# Patient Record
Sex: Female | Born: 1955
Health system: Southern US, Community
[De-identification: ages and names within clinical notes are randomized; demographics above are authoritative.]

## PROBLEM LIST (undated history)

## (undated) DIAGNOSIS — R011 Cardiac murmur, unspecified: Secondary | ICD-10-CM

## (undated) DIAGNOSIS — M810 Age-related osteoporosis without current pathological fracture: Secondary | ICD-10-CM

## (undated) DIAGNOSIS — M7989 Other specified soft tissue disorders: Secondary | ICD-10-CM

## (undated) DIAGNOSIS — E079 Disorder of thyroid, unspecified: Secondary | ICD-10-CM

## (undated) DIAGNOSIS — R5383 Other fatigue: Secondary | ICD-10-CM

## (undated) DIAGNOSIS — M1710 Unilateral primary osteoarthritis, unspecified knee: Secondary | ICD-10-CM

## (undated) DIAGNOSIS — M1990 Unspecified osteoarthritis, unspecified site: Secondary | ICD-10-CM

## (undated) DIAGNOSIS — T4145XA Adverse effect of unspecified anesthetic, initial encounter: Secondary | ICD-10-CM

## (undated) DIAGNOSIS — R112 Nausea with vomiting, unspecified: Secondary | ICD-10-CM

## (undated) DIAGNOSIS — H04123 Dry eye syndrome of bilateral lacrimal glands: Secondary | ICD-10-CM

## (undated) DIAGNOSIS — E039 Hypothyroidism, unspecified: Secondary | ICD-10-CM

## (undated) DIAGNOSIS — R51 Headache: Secondary | ICD-10-CM

## (undated) DIAGNOSIS — M797 Fibromyalgia: Secondary | ICD-10-CM

## (undated) DIAGNOSIS — C801 Malignant (primary) neoplasm, unspecified: Secondary | ICD-10-CM

## (undated) DIAGNOSIS — M791 Myalgia, unspecified site: Secondary | ICD-10-CM

## (undated) DIAGNOSIS — R202 Paresthesia of skin: Secondary | ICD-10-CM

## (undated) DIAGNOSIS — Z9889 Other specified postprocedural states: Secondary | ICD-10-CM

## (undated) DIAGNOSIS — T8859XA Other complications of anesthesia, initial encounter: Secondary | ICD-10-CM

## (undated) DIAGNOSIS — M542 Cervicalgia: Secondary | ICD-10-CM

## (undated) DIAGNOSIS — E890 Postprocedural hypothyroidism: Secondary | ICD-10-CM

## (undated) DIAGNOSIS — F419 Anxiety disorder, unspecified: Secondary | ICD-10-CM

## (undated) DIAGNOSIS — K219 Gastro-esophageal reflux disease without esophagitis: Secondary | ICD-10-CM

## (undated) DIAGNOSIS — M5030 Other cervical disc degeneration, unspecified cervical region: Secondary | ICD-10-CM

## (undated) DIAGNOSIS — M7730 Calcaneal spur, unspecified foot: Secondary | ICD-10-CM

## (undated) DIAGNOSIS — C73 Malignant neoplasm of thyroid gland: Secondary | ICD-10-CM

## (undated) DIAGNOSIS — M722 Plantar fascial fibromatosis: Secondary | ICD-10-CM

## (undated) HISTORY — DX: Disorder of thyroid, unspecified: E07.9

## (undated) HISTORY — DX: Other fatigue: R53.83

## (undated) HISTORY — DX: Dry eye syndrome of bilateral lacrimal glands: H04.123

## (undated) HISTORY — DX: Myalgia, unspecified site: M79.10

## (undated) HISTORY — DX: Other cervical disc degeneration, unspecified cervical region: M50.30

## (undated) HISTORY — PX: ABDOMINAL HYSTERECTOMY: SHX81

## (undated) HISTORY — DX: Age-related osteoporosis without current pathological fracture: M81.0

## (undated) HISTORY — PX: KNEE SURGERY: SHX244

## (undated) HISTORY — DX: Other specified soft tissue disorders: M79.89

## (undated) HISTORY — PX: BUNIONECTOMY: SHX129

---
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
## 1997-03-04 HISTORY — PX: OTHER SURGICAL HISTORY: SHX169

## 1997-12-19 ENCOUNTER — Other Ambulatory Visit: Admission: RE | Admit: 1997-12-19 | Discharge: 1997-12-19 | Payer: Self-pay | Admitting: Obstetrics and Gynecology

## 2000-03-12 ENCOUNTER — Other Ambulatory Visit: Admission: RE | Admit: 2000-03-12 | Discharge: 2000-03-12 | Payer: Self-pay | Admitting: Obstetrics and Gynecology

## 2001-04-17 ENCOUNTER — Other Ambulatory Visit: Admission: RE | Admit: 2001-04-17 | Discharge: 2001-04-17 | Payer: Self-pay | Admitting: Obstetrics and Gynecology

## 2002-08-18 ENCOUNTER — Other Ambulatory Visit: Admission: RE | Admit: 2002-08-18 | Discharge: 2002-08-18 | Payer: Self-pay | Admitting: Obstetrics and Gynecology

## 2002-08-24 ENCOUNTER — Encounter: Payer: Self-pay | Admitting: Obstetrics and Gynecology

## 2002-08-24 ENCOUNTER — Encounter: Admission: RE | Admit: 2002-08-24 | Discharge: 2002-08-24 | Payer: Self-pay | Admitting: Obstetrics and Gynecology

## 2005-01-03 ENCOUNTER — Other Ambulatory Visit: Admission: RE | Admit: 2005-01-03 | Discharge: 2005-01-03 | Payer: Self-pay | Admitting: Obstetrics and Gynecology

## 2006-09-19 ENCOUNTER — Ambulatory Visit (HOSPITAL_COMMUNITY): Admission: RE | Admit: 2006-09-19 | Discharge: 2006-09-19 | Payer: Self-pay | Admitting: Obstetrics and Gynecology

## 2006-11-26 ENCOUNTER — Other Ambulatory Visit: Admission: RE | Admit: 2006-11-26 | Discharge: 2006-11-26 | Payer: Self-pay | Admitting: Interventional Radiology

## 2006-11-26 ENCOUNTER — Encounter: Admission: RE | Admit: 2006-11-26 | Discharge: 2006-11-26 | Payer: Self-pay | Admitting: Surgery

## 2006-11-26 ENCOUNTER — Encounter (INDEPENDENT_AMBULATORY_CARE_PROVIDER_SITE_OTHER): Payer: Self-pay | Admitting: Interventional Radiology

## 2007-06-19 ENCOUNTER — Encounter: Admission: RE | Admit: 2007-06-19 | Discharge: 2007-06-19 | Payer: Self-pay | Admitting: Surgery

## 2008-03-01 ENCOUNTER — Encounter: Admission: RE | Admit: 2008-03-01 | Discharge: 2008-03-01 | Payer: Self-pay | Admitting: Endocrinology

## 2008-12-01 IMAGING — US US BIOPSY
1 series · 7 of 7 positions shown · non-contrast
Comparison: none

CLINICAL DATA: Patient with history of left thyroid nodule and thyroid ultrasound at [HOSPITAL] on 09/19/06 which revealed multiple bilateral nodules, the largest of which is in the left upper pole measuring 1.8 x 1.1 x 1.4 cm.  Request is now made for fine needle aspiration of this dominant left upper pole thyroid nodule.
ULTRASOUND-GUIDED FINE NEEDLE ASPIRATION, DOMINANT LEFT UPPER POLE THYROID NODULE:

[Series 1: us biopsy · 7 acquisitions, 7 frames shown]
[im 1/7]
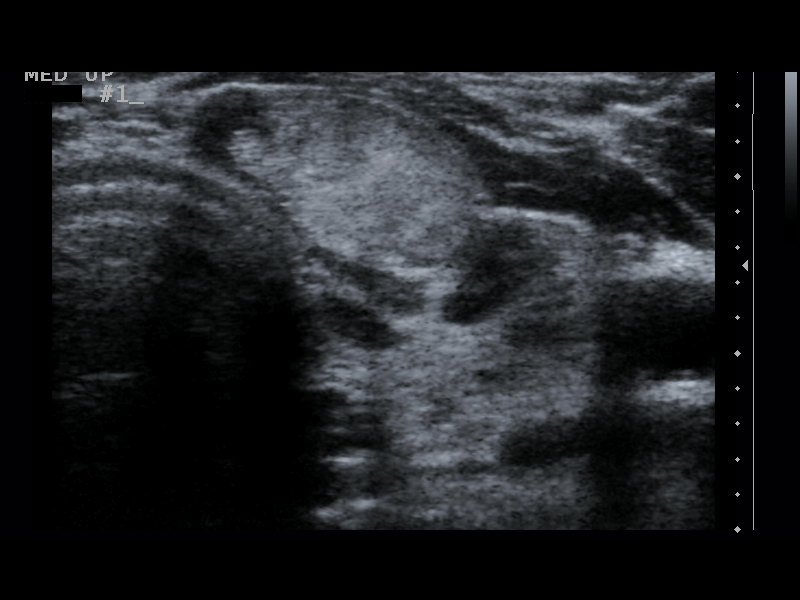
[im 2/7]
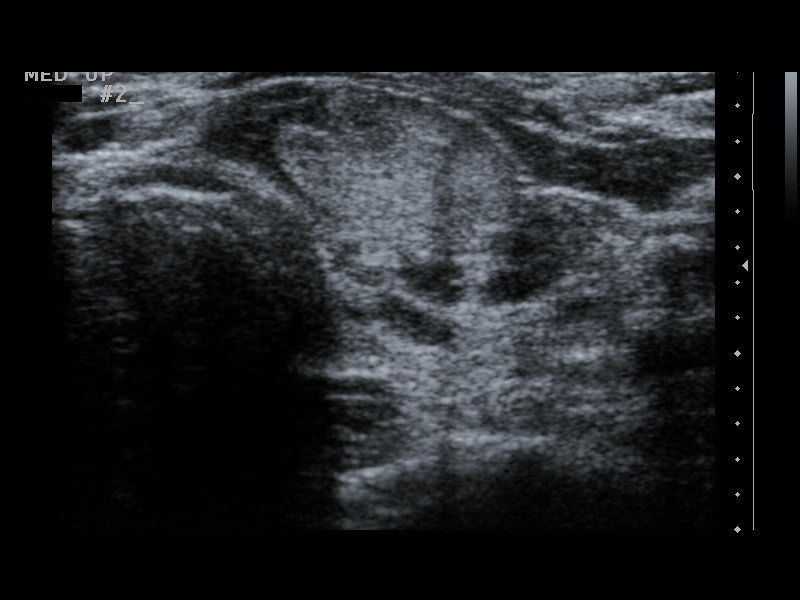
[im 3/7]
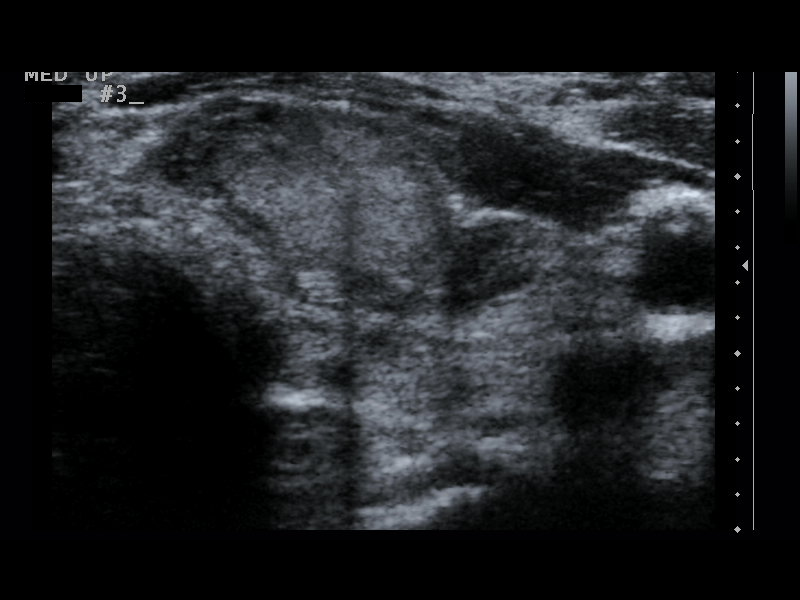
[im 4/7]
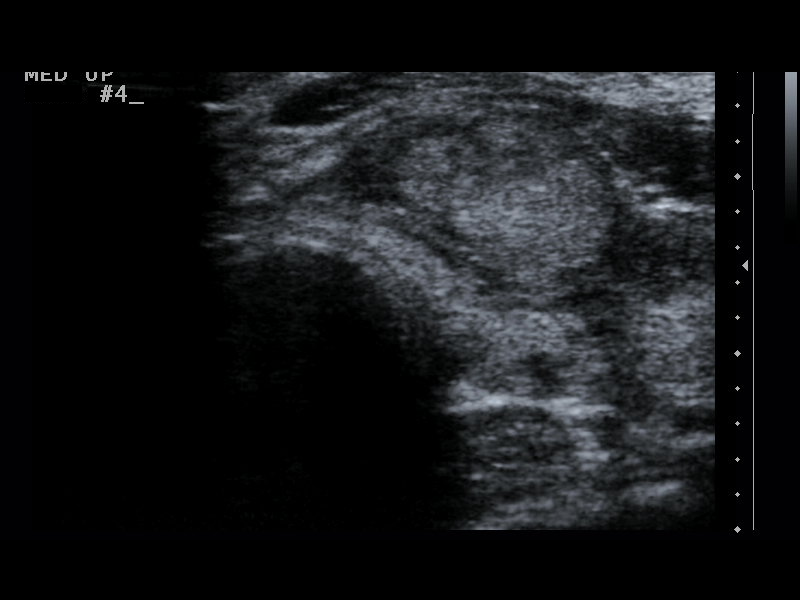
[im 5/7]
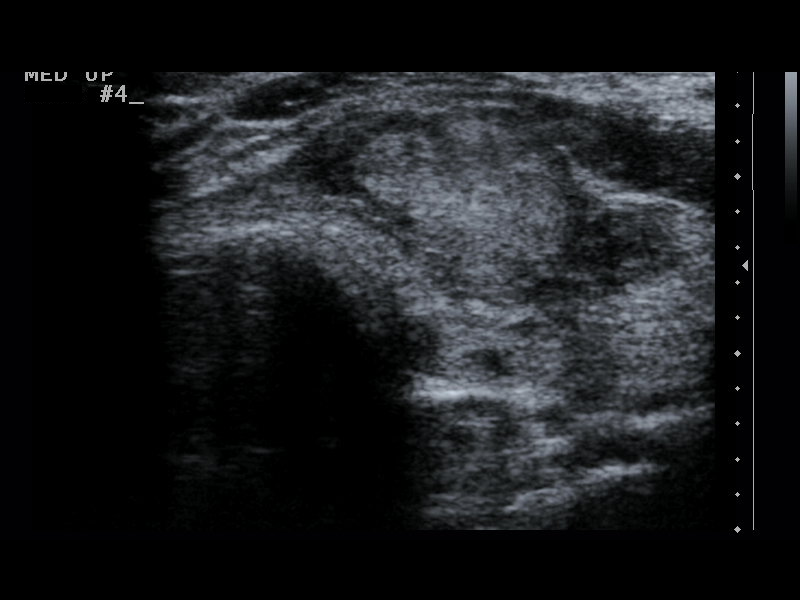
[im 6/7]
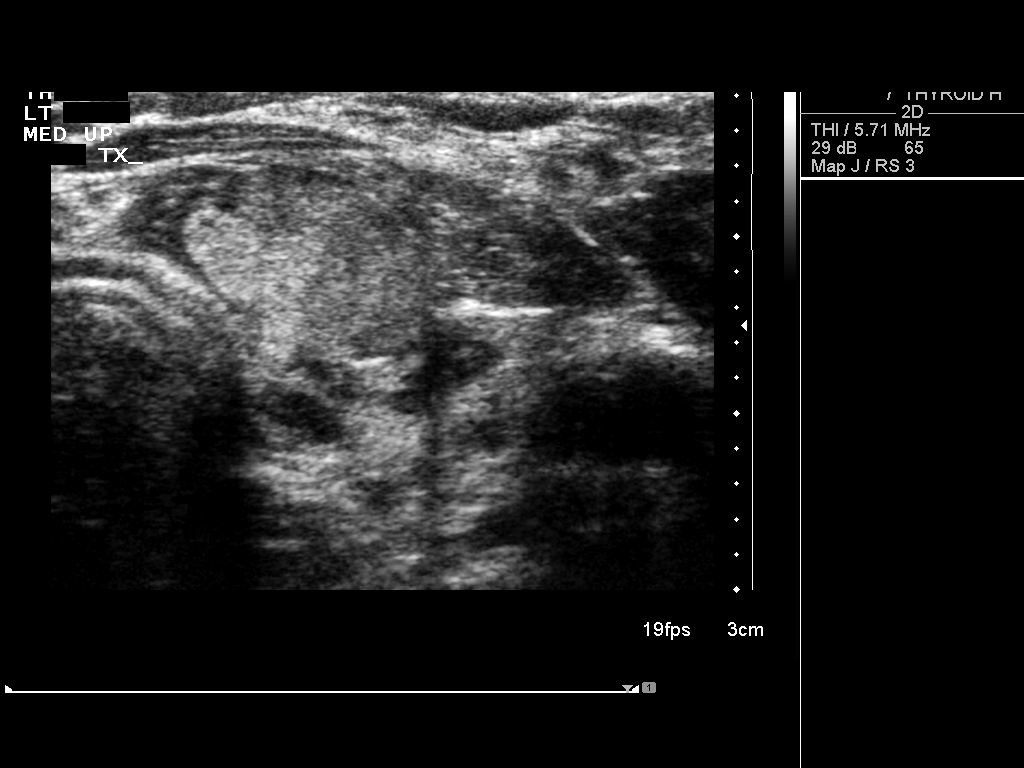
[im 7/7]
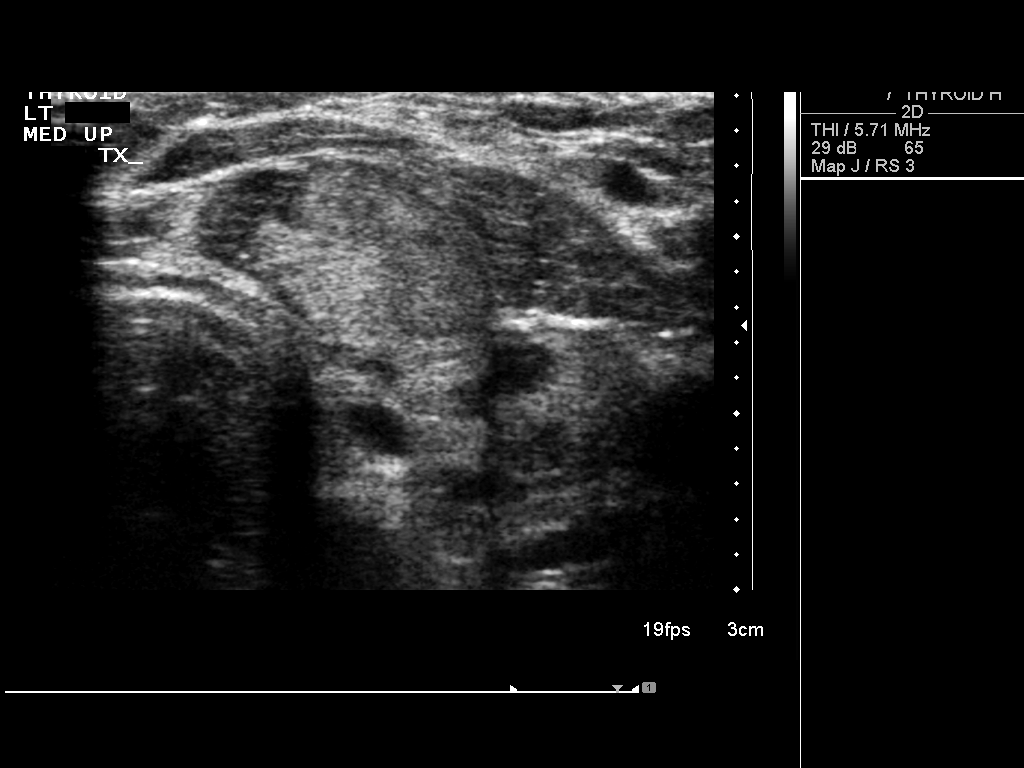

[7 of 7 positions shown; findings below may reference images not displayed]

FINDINGS: The above procedure was thoroughly discussed with the patient and written informed consent was obtained. 
Ultrasound was then performed to localize and mark an adequate site for the biopsy.  The patient was then prepped and draped in a normal sterile fashion.  1% lidocaine was used for local anesthesia.  Under direct ultrasound guidance, four passes were made using  25 gauge hypodermic needles into the dominant nodule located within the left upper pole of the thyroid.  Ultrasound confirmed placement of the needle on all four occasions.  The specimens were sent to Pathology for further analysis.  Post procedure imaging demonstrated no hematoma or immediate complication.  The patient tolerated the procedure well.
IMPRESSION: Successful ultrasound-guided fine needle aspiration, dominant left upper pole thyroid nodule.  Final pathology pending.

## 2009-02-17 ENCOUNTER — Encounter: Admission: RE | Admit: 2009-02-17 | Discharge: 2009-02-17 | Payer: Self-pay | Admitting: Endocrinology

## 2009-06-24 IMAGING — US US SOFT TISSUE HEAD/NECK
1 series · 14 of 25 positions shown · non-contrast
Comparison: 09/19/2006

CLINICAL DATA: Multiple thyroid nodules

THYROID ULTRASOUND
TECHNIQUE: Ultrasound examination of the thyroid gland and
adjacent soft tissues was performed.

[Series 1: us soft tissue head/neck · 0.08mm/px · 14 of 33 slices shown]
[im 1/33]
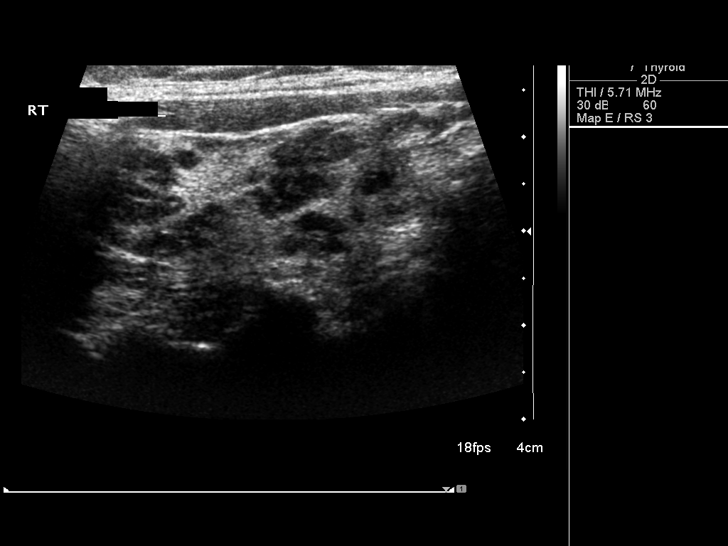
[im 3/33]
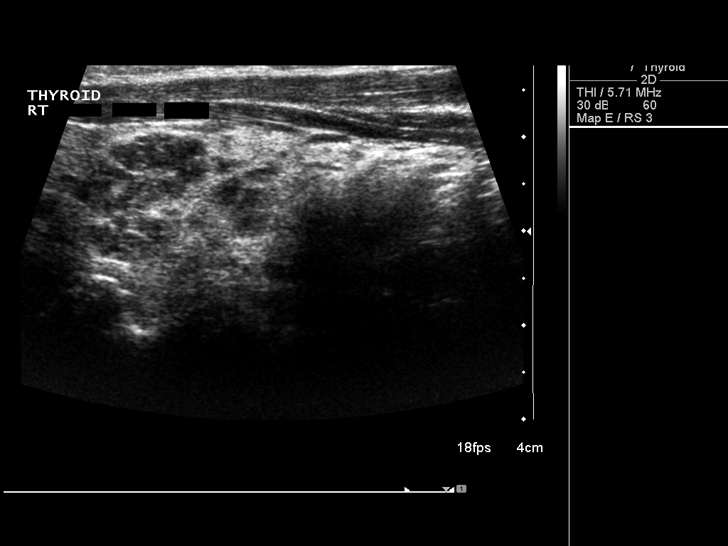
[im 6/33]
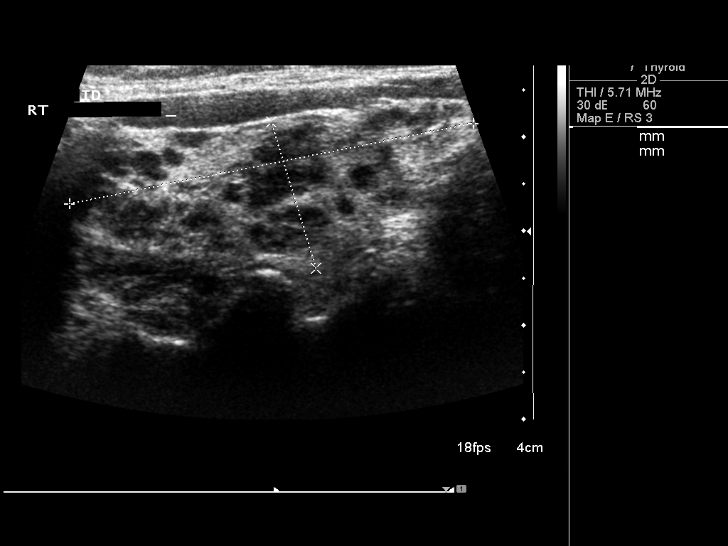
[im 9/33]
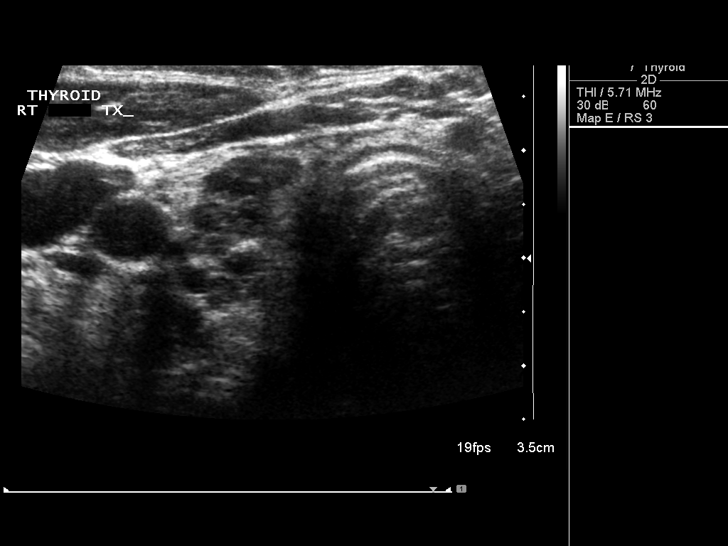
[im 11/33]
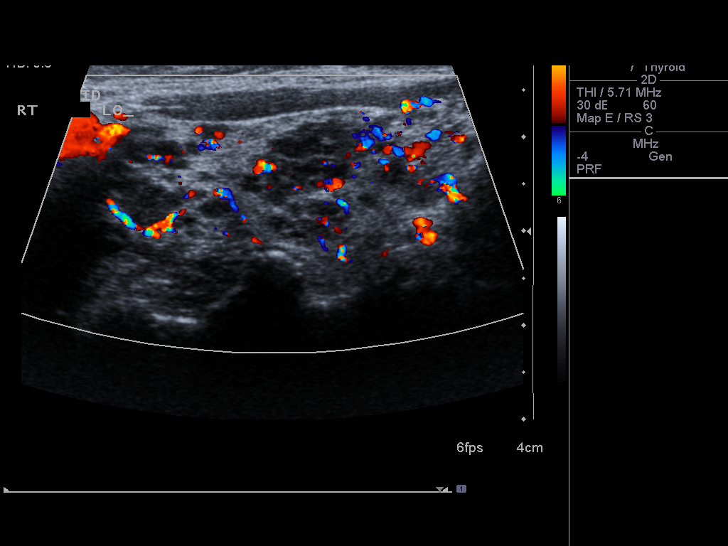
[im 13/33]
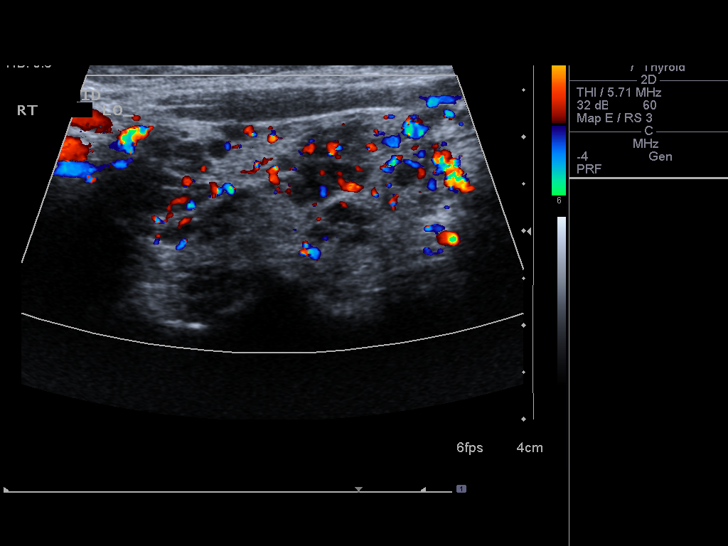
[im 15/33]
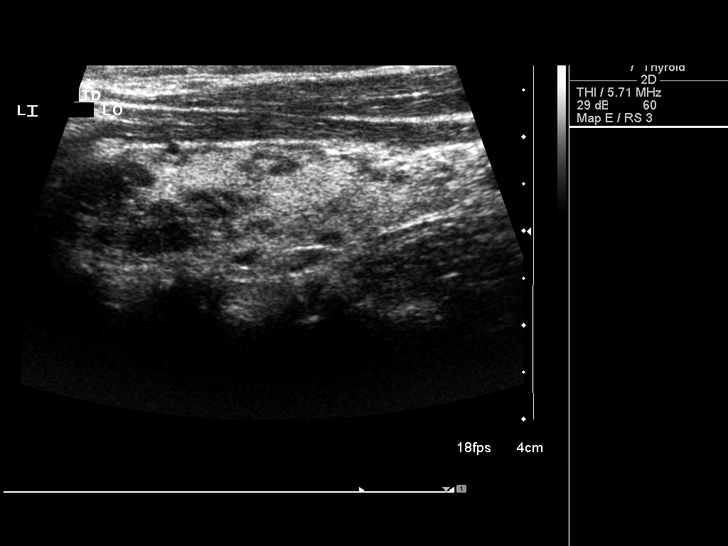
[im 18/33]
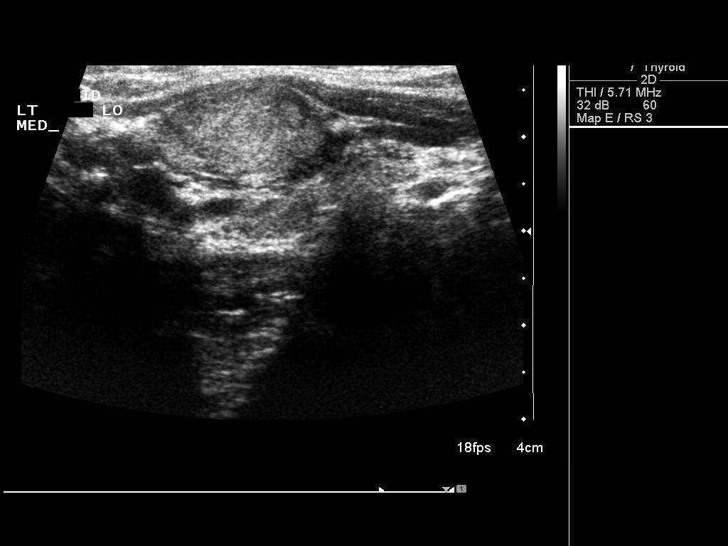
[im 21/33]
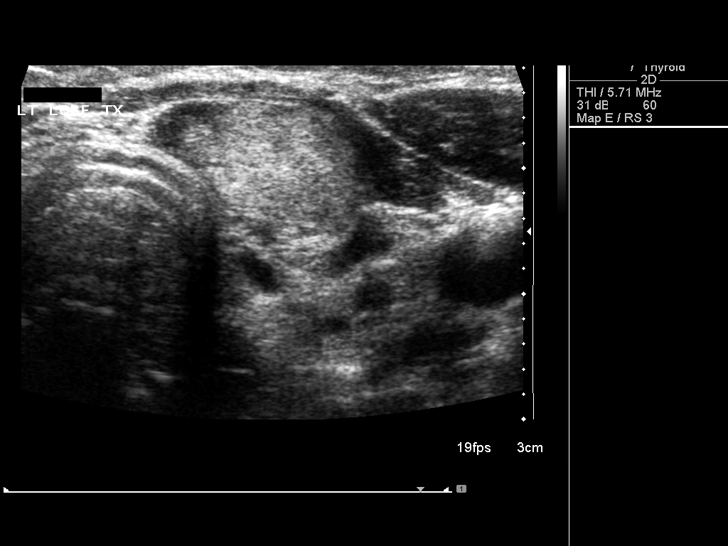
[im 22/33]
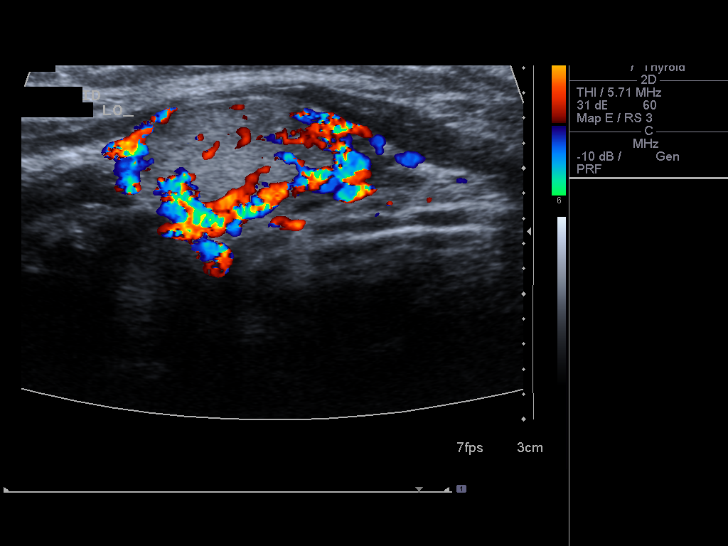
[im 25/33]
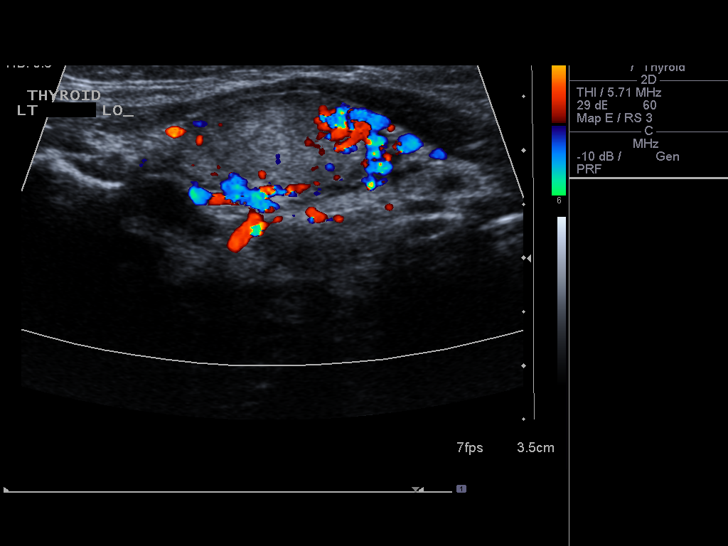
[im 27/33]
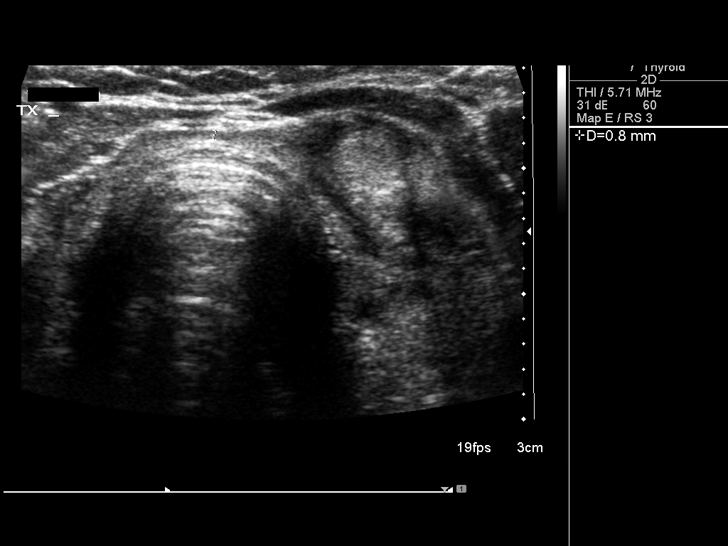
[im 30/33]
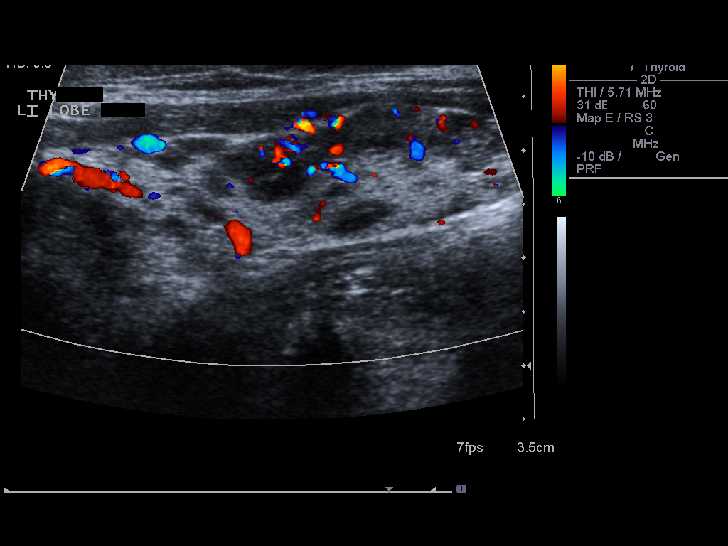
[im 33/33]
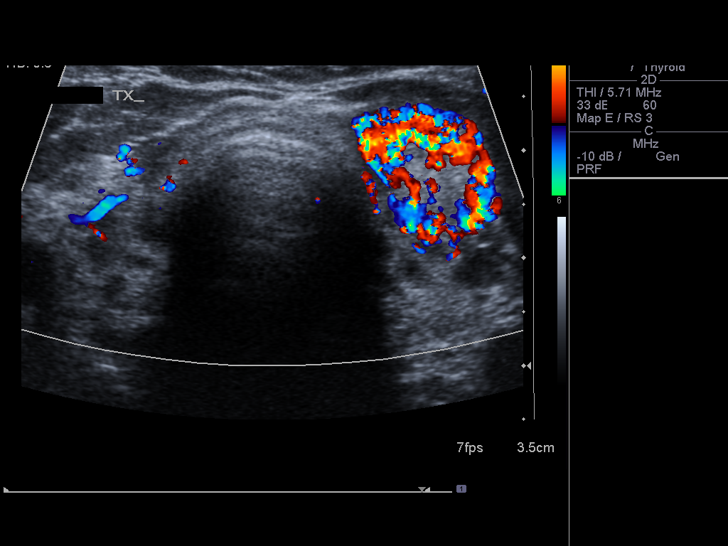

[14 of 25 positions shown; findings below may reference images not displayed]

FINDINGS: The right thyroid lobe measures 4.5 x 1.6 x 1.5 cm.  The lobe
measures 4.8 x 1.5 x 1.8 cm.  The isthmus is eight mm in thickenss.
Thyroid parenchyma is markedly heterogeneous throughout.  A 2.1 x
0.9 x 1.6 cm solid nodule is identified in the medial aspect of the
left thyroid lobe.  This does show associated flow signal on color
Doppler imaging bed no detectable dystrophic calcification is seen
within the lesion.  This represents the nodule which was biopsied
on 11/26/06.
IMPRESSION: No substantial interval change exam.  The thyroid parenchyma
remains markedly heterogeneous.  Dominant nodule in the left
thyroid lobe is again identified and has been biopsied previously.
Although nodules have been measured within the thyroid parenchyma
bilaterally on previous studies, on today's exam these are markedly
ill-defined and have features more suggestive of simply being
related to the background parenchymal heterogeneity.

## 2010-03-07 IMAGING — US US SOFT TISSUE HEAD/NECK
1 series · 14 of 16 positions shown · non-contrast
Comparison: Ultrasound of the thyroid of 06/19/2007

CLINICAL DATA: Follow up of thyroid nodule

THYROID ULTRASOUND
TECHNIQUE: Ultrasound examination of the thyroid gland and
adjacent soft tissues was performed.

[Series 1: us soft tissue head/neck · 0.08mm/px · 14 of 16 slices shown]
[im 1/16]
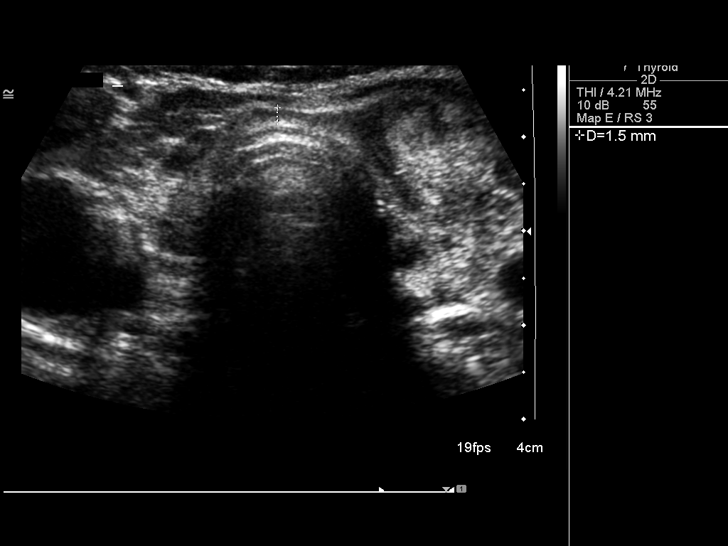
[im 2/16]
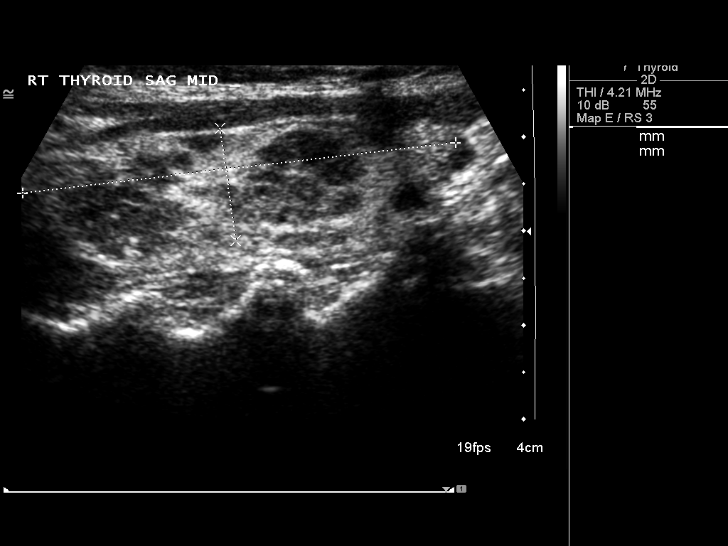
[im 3/16]
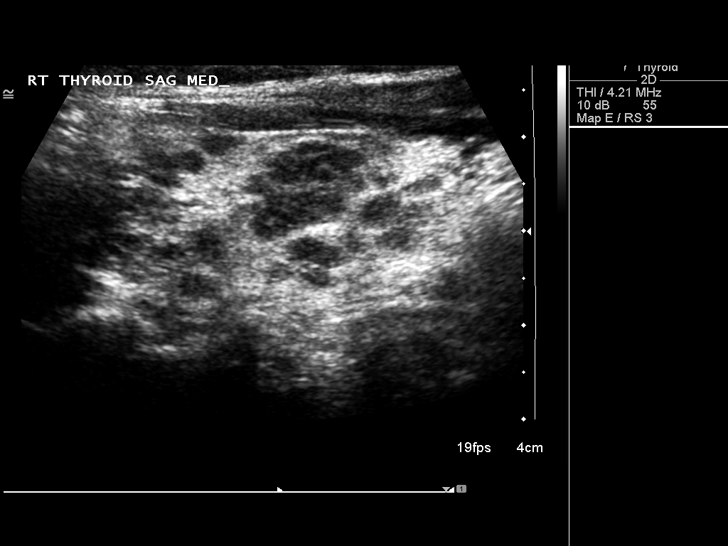
[im 5/16]
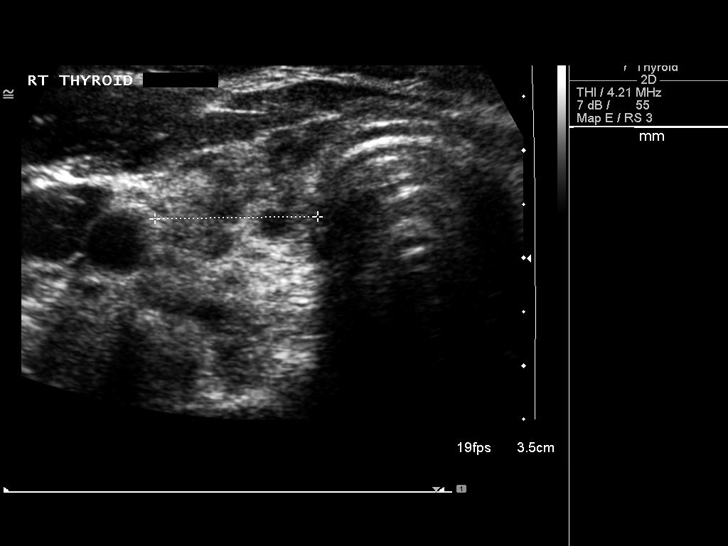
[im 6/16]
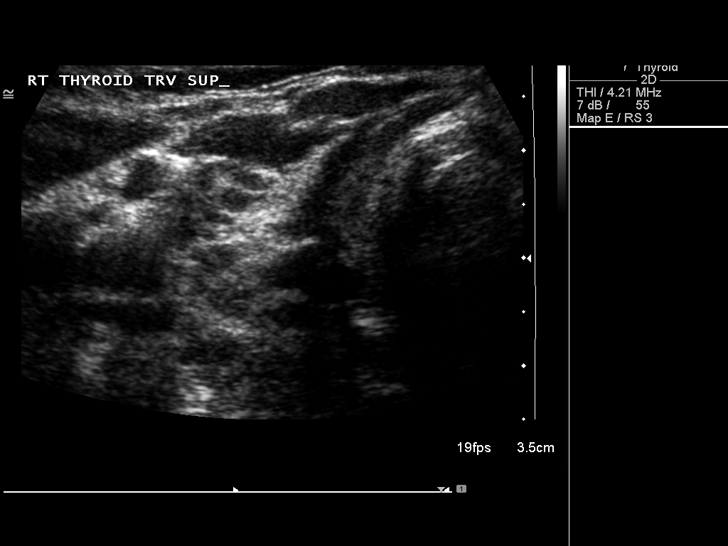
[im 7/16]
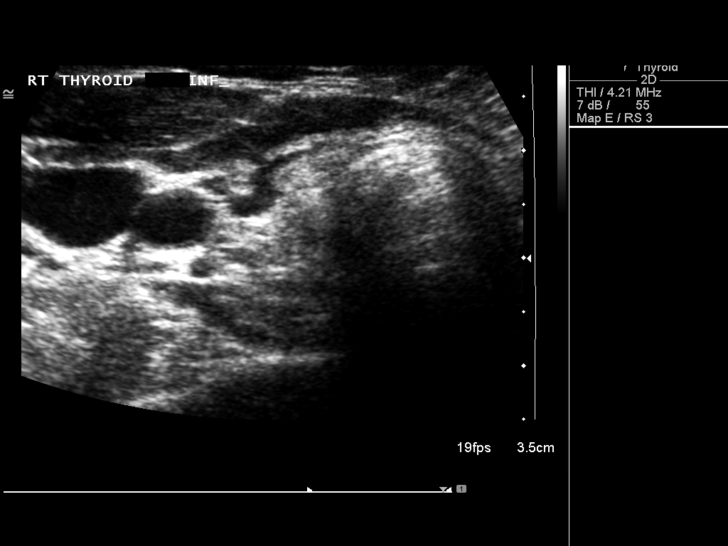
[im 8/16]
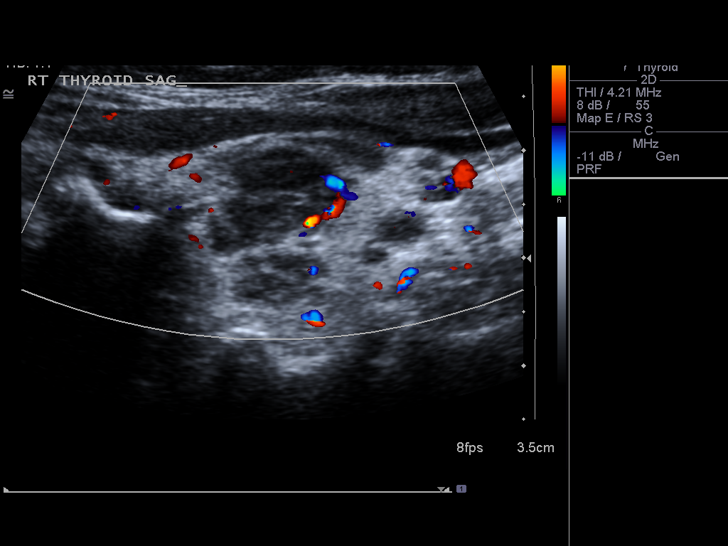
[im 9/16]
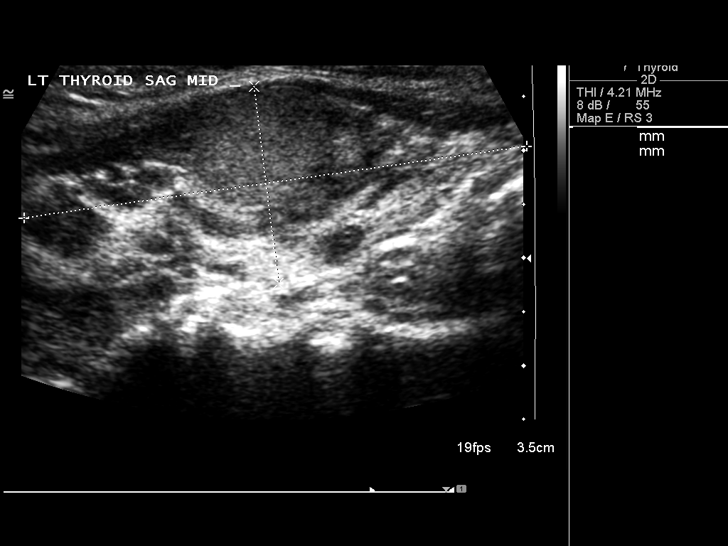
[im 10/16]
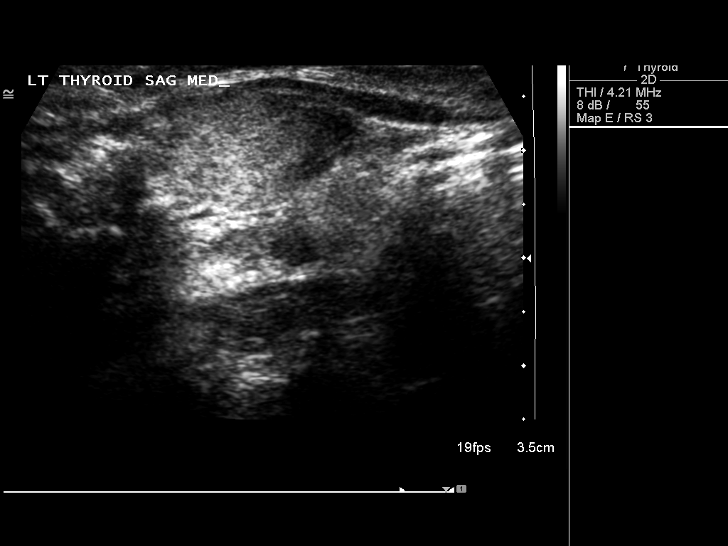
[im 11/16]
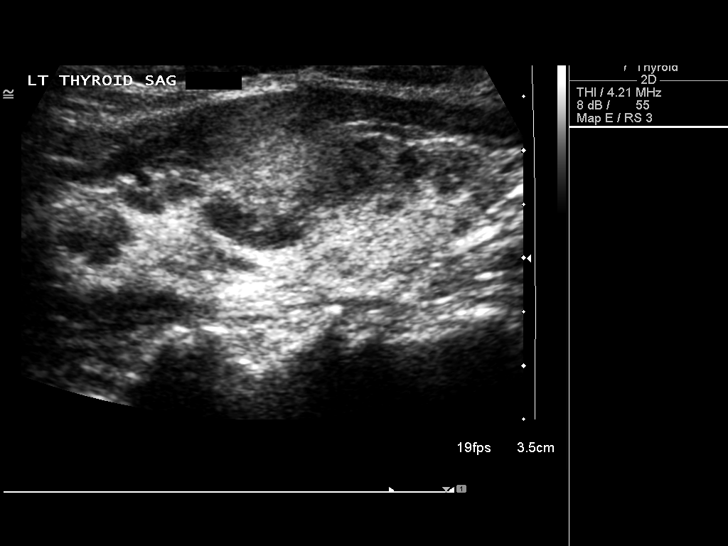
[im 13/16]
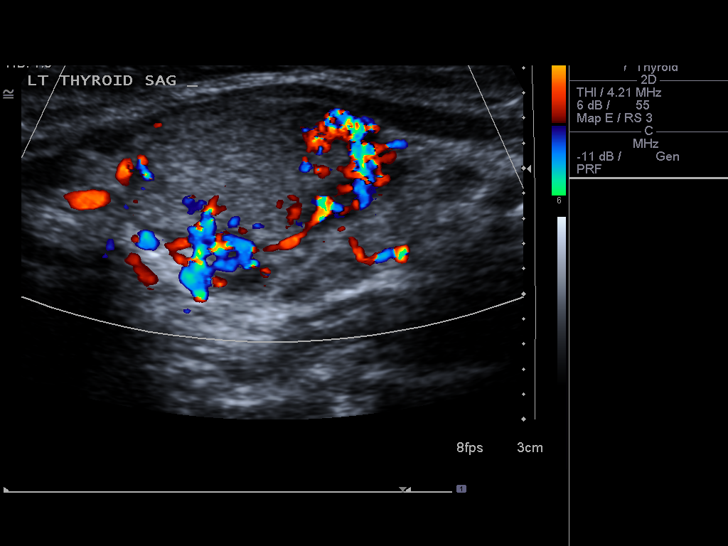
[im 14/16]
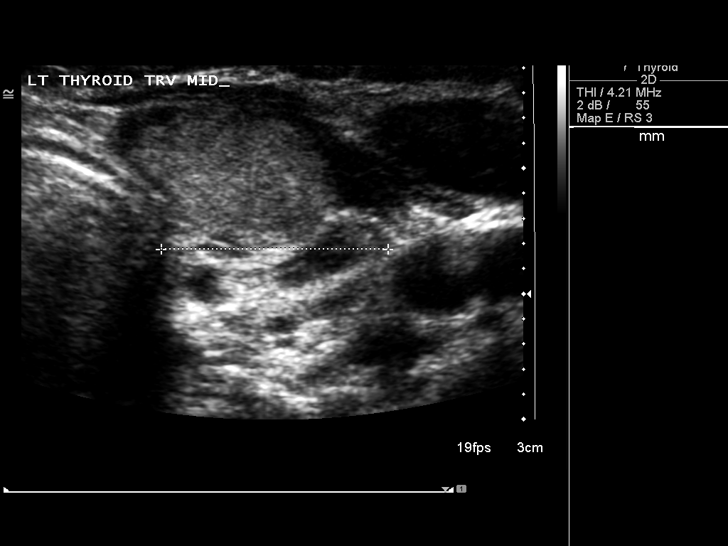
[im 15/16]
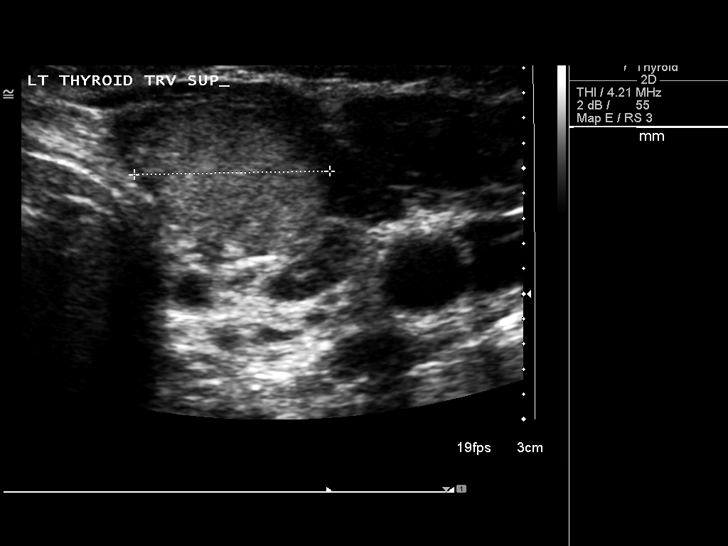
[im 16/16]
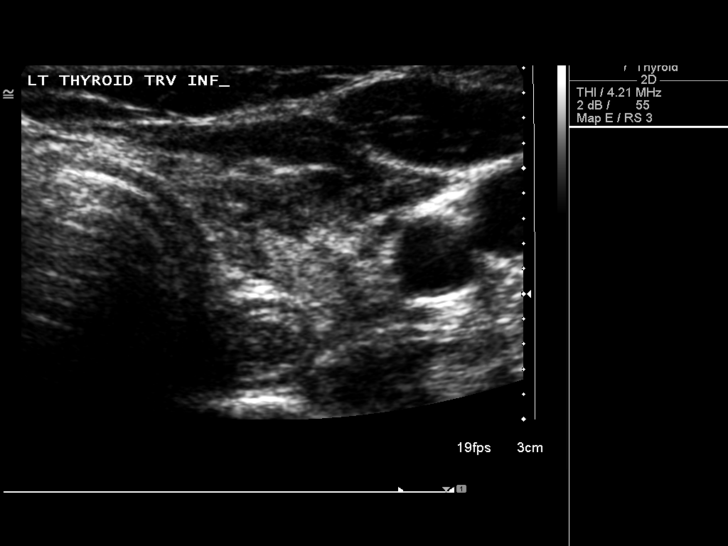

[14 of 16 positions shown; findings below may reference images not displayed]

FINDINGS: The thyroid gland is stable in size.  The right lobe
measures 4.6 cm sagittally, with a depth of 1.2 cm and width of
cm.  The left lobe measures 4.7 x 1.8 x 1.8 cm, with the isthmus
measuring 1.5 mm.  The gland is diffusely inhomogeneous.  The solid
nodule noted previously within the mid medial left upper pole is
stable at 2.1 x 1.1 x 1.7 cm and is solid.  No other discrete
nodule is detected.
IMPRESSION: 1.  Stable solid dominant nodule in the left upper lobe.  No new
abnormality.
2.  No change in size of thyroid gland.

## 2010-10-02 DIAGNOSIS — R5383 Other fatigue: Secondary | ICD-10-CM

## 2010-10-02 DIAGNOSIS — M7989 Other specified soft tissue disorders: Secondary | ICD-10-CM

## 2010-10-02 DIAGNOSIS — M722 Plantar fascial fibromatosis: Secondary | ICD-10-CM | POA: Insufficient documentation

## 2010-10-02 DIAGNOSIS — E079 Disorder of thyroid, unspecified: Secondary | ICD-10-CM | POA: Insufficient documentation

## 2010-10-02 DIAGNOSIS — M7730 Calcaneal spur, unspecified foot: Secondary | ICD-10-CM | POA: Insufficient documentation

## 2010-10-02 HISTORY — DX: Plantar fascial fibromatosis: M72.2

## 2010-10-02 HISTORY — DX: Calcaneal spur, unspecified foot: M77.30

## 2011-02-23 IMAGING — US US SOFT TISSUE HEAD/NECK
1 series · 14 of 25 positions shown · non-contrast
Comparison: Ultrasound of the thyroid of 03/01/2008

CLINICAL DATA: Follow up of thyroid nodules

THYROID ULTRASOUND
TECHNIQUE: Ultrasound examination of the thyroid gland and
adjacent soft tissues was performed.

[Series 1: us soft tissue head/neck · 0.05mm/px · 14 of 38 slices shown]
[im 1/38]
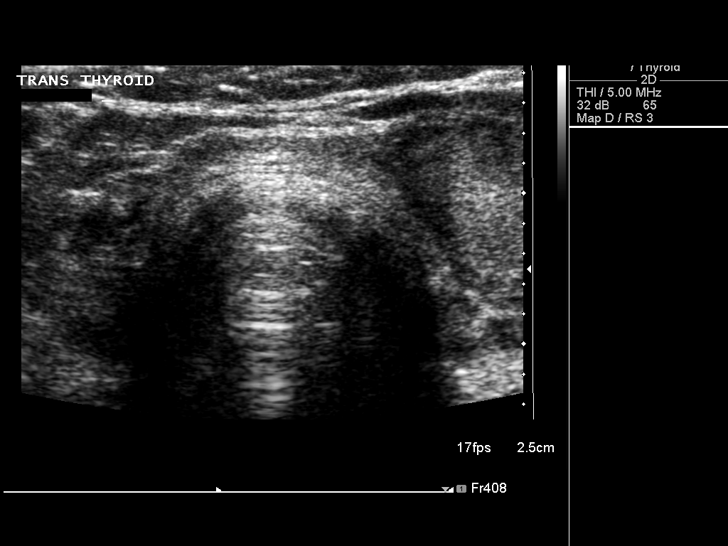
[im 4/38]
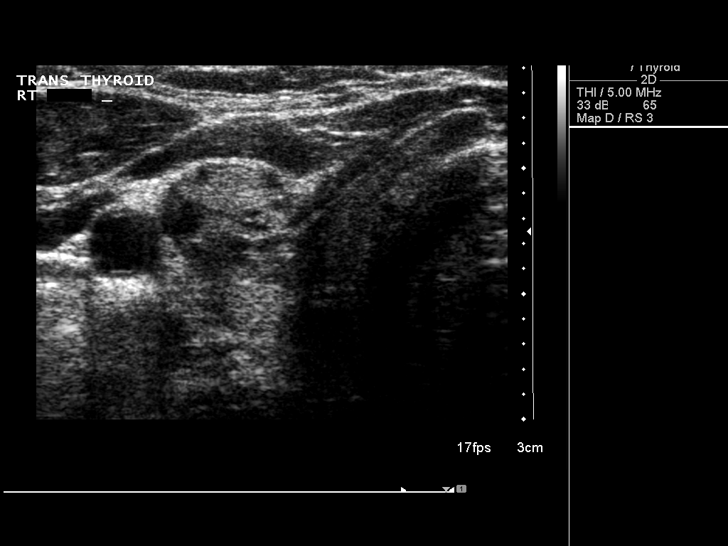
[im 7/38]
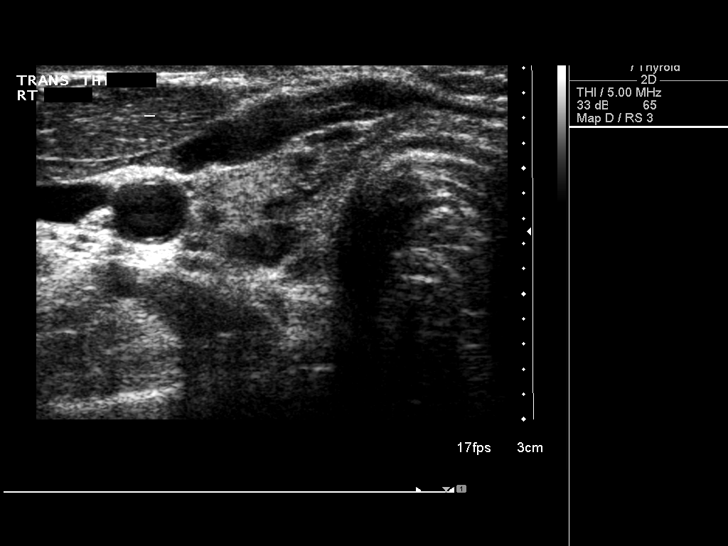
[im 10/38]
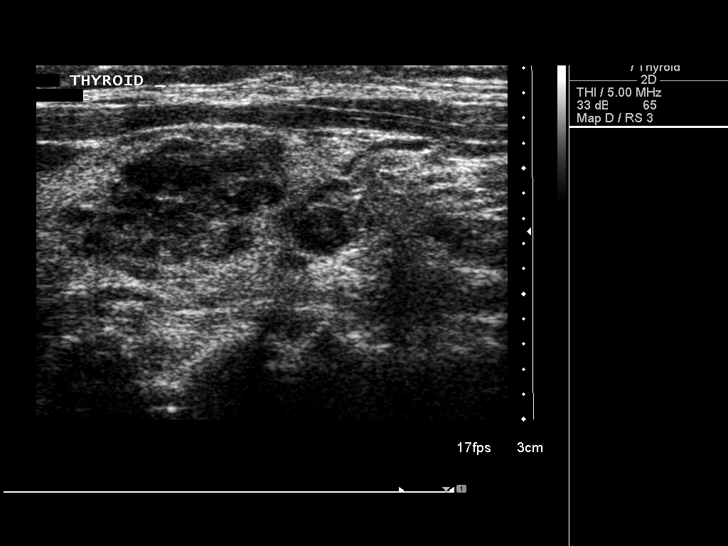
[im 13/38]
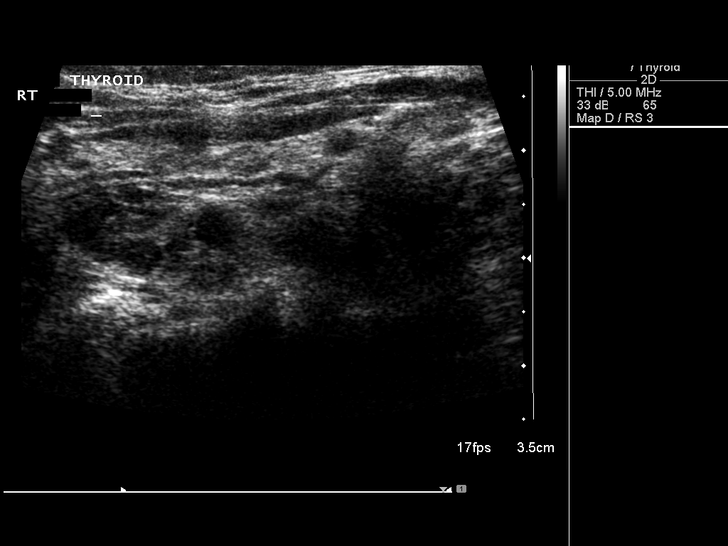
[im 14/38]
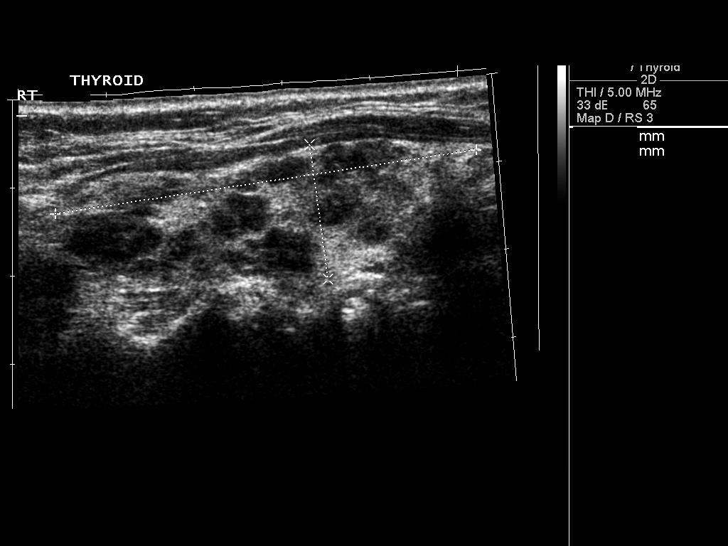
[im 17/38]
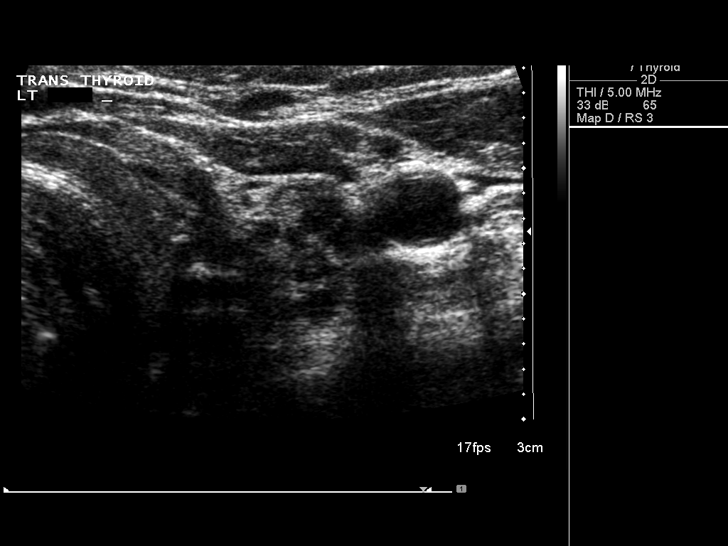
[im 21/38]
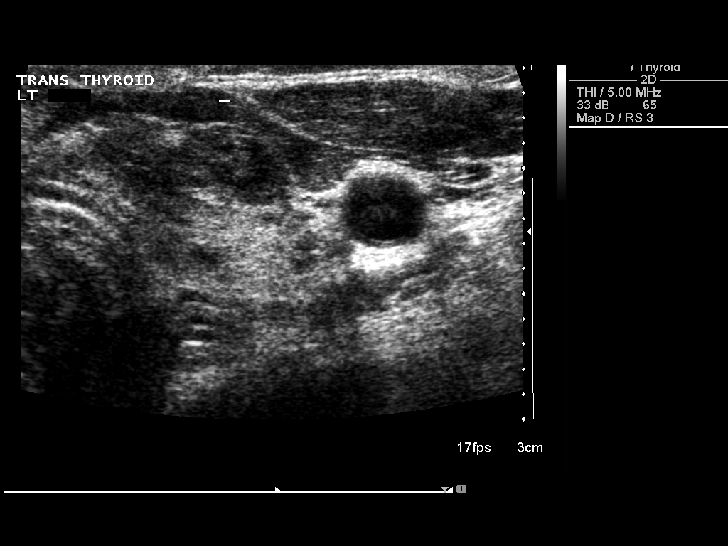
[im 24/38]
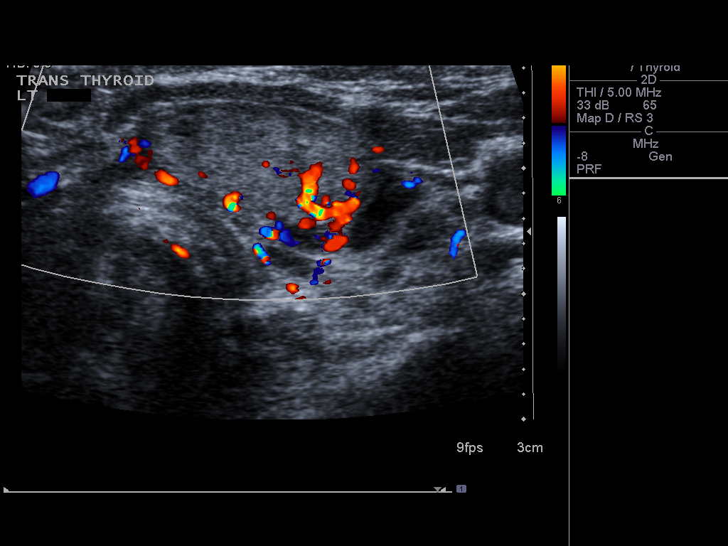
[im 25/38]
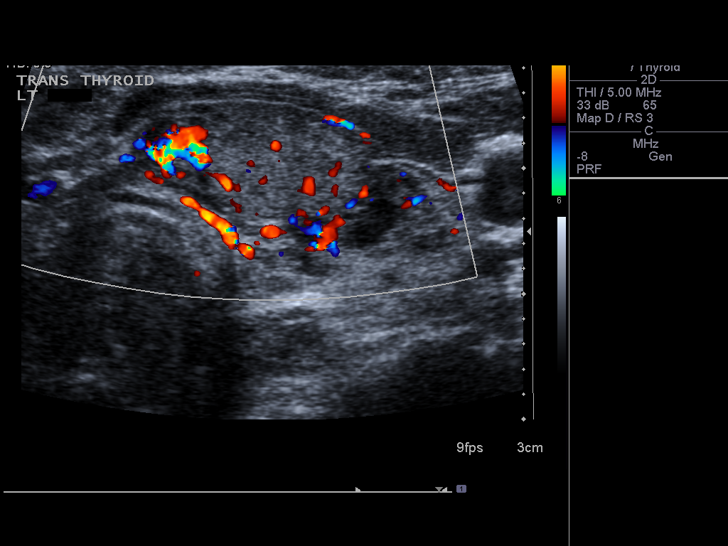
[im 28/38]
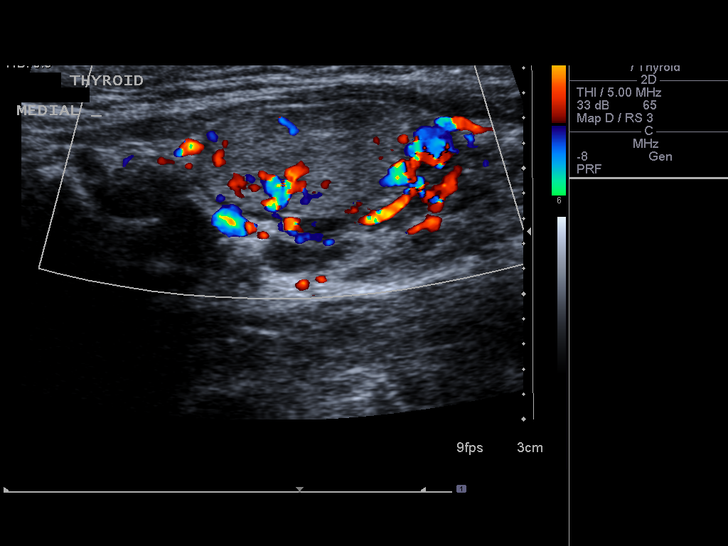
[im 31/38]
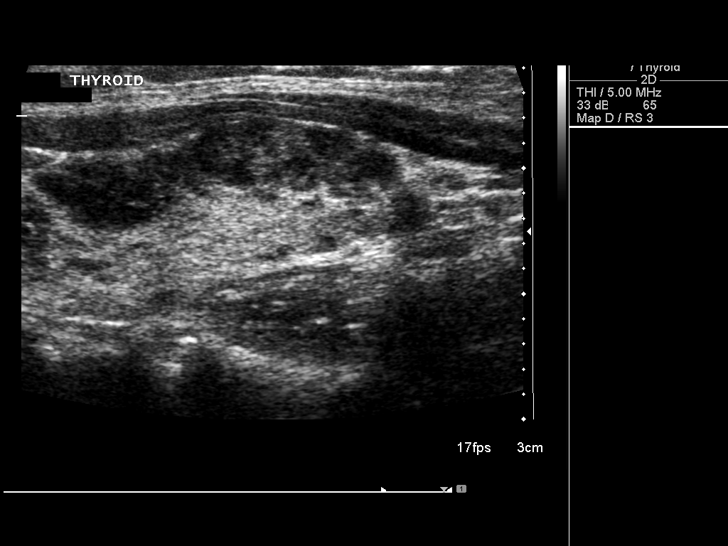
[im 34/38]
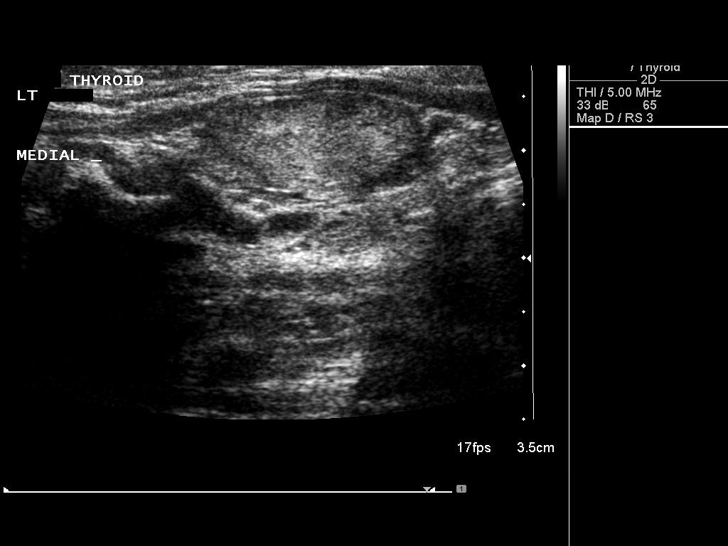
[im 38/38]
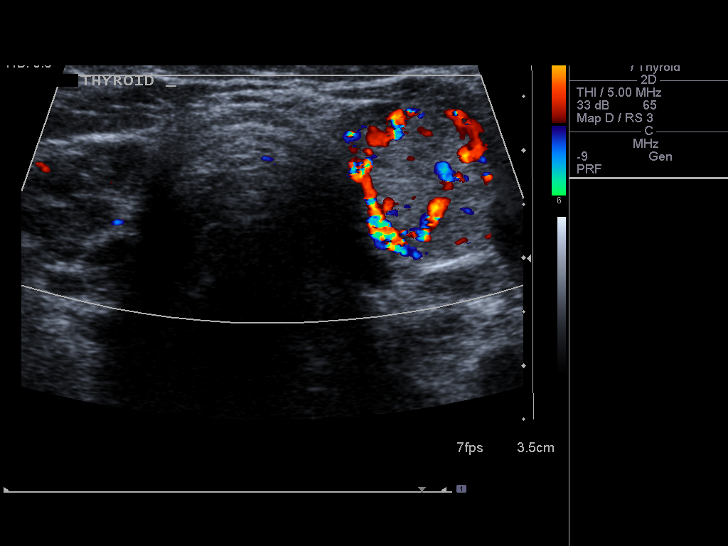

[14 of 25 positions shown; findings below may reference images not displayed]

FINDINGS: The thyroid gland is stable in size.  The right lobe
measures 4.8 cm sagittally, with a depth of 1.5 cm and width of
cm.  (Prior measurements were 4.6 x 1.2 x 1.5 cm).  The left lobe
currently measures 4.9 x 1.4 x 1.8 cm, with the isthmus measuring
1.9 mm.  (Prior measurements were 4.7 x 1.8 x 1.8 cm with the
isthmus measuring 1.5 mm).  The gland remains very inhomogeneous.
A solid nodule is stable in the upper left lobe medially measuring
2.1 x 1.0 x 1.9 cm compared to prior measurements of 2.1 x 1.1 x
1.7 cm.  The gland remains very lobular bilaterally.
IMPRESSION: No change in very lobular gland with single solid measurable nodule
in the medial left upper lobe remaining stable.

## 2013-04-04 DIAGNOSIS — C801 Malignant (primary) neoplasm, unspecified: Secondary | ICD-10-CM

## 2013-04-04 HISTORY — DX: Malignant (primary) neoplasm, unspecified: C80.1

## 2013-04-06 HISTORY — PX: TOTAL THYROIDECTOMY: SHX2547

## 2013-04-16 ENCOUNTER — Ambulatory Visit: Payer: Self-pay | Admitting: Endocrinology

## 2013-04-27 ENCOUNTER — Ambulatory Visit: Payer: Self-pay | Admitting: Endocrinology

## 2013-04-30 ENCOUNTER — Ambulatory Visit (INDEPENDENT_AMBULATORY_CARE_PROVIDER_SITE_OTHER): Payer: BC Managed Care – PPO | Admitting: Endocrinology

## 2013-04-30 ENCOUNTER — Encounter: Payer: Self-pay | Admitting: Endocrinology

## 2013-04-30 VITALS — BP 120/76 | HR 61 | Temp 98.9°F | Ht 65.0 in | Wt 168.0 lb

## 2013-04-30 DIAGNOSIS — R202 Paresthesia of skin: Secondary | ICD-10-CM

## 2013-04-30 DIAGNOSIS — R209 Unspecified disturbances of skin sensation: Secondary | ICD-10-CM

## 2013-04-30 DIAGNOSIS — E890 Postprocedural hypothyroidism: Secondary | ICD-10-CM

## 2013-04-30 HISTORY — DX: Paresthesia of skin: R20.2

## 2013-04-30 HISTORY — DX: Postprocedural hypothyroidism: E89.0

## 2013-04-30 LAB — TSH: TSH: 17.74 u[IU]/mL — AB (ref 0.35–5.50)

## 2013-04-30 NOTE — Progress Notes (Signed)
Subjective:    Patient ID: Michele Glenn, female    DOB: 1955-12-14, 58 y.o.   MRN: [id number]  HPI In 2008, pt was noted to have a nodule goiter.  bx then was c/w follicular lesion.  She had thyroidectomy in February of 2015.  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  pt states she has slight discomfort at the anterior neck, and assoc fatigue.  She alternates 25 and 50 mcg per day, of synthroid.   Past Medical History  Diagnosis Date  . Muscle pain   . Bilateral swelling of feet   . Thyroid disease   . Fatigue     No past surgical history on file.  History   Social History  . Marital Status: Married    Spouse Name: N/A    Number of Children: N/A  . Years of Education: N/A   Occupational History  . Not on file.   Social History Main Topics  . Smoking status: Unknown If Ever Smoked  . Smokeless tobacco: Not on file  . Alcohol Use: No  . Drug Use: Not on file  . Sexual Activity: Not on file   Other Topics Concern  . Not on file   Social History Narrative  . No narrative on file    Current Outpatient Prescriptions on File Prior to Visit  Medication Sig Dispense Refill  . ETODOLAC PO Take by mouth.        . Levothyroxine Sodium (SYNTHROID PO) Take by mouth.        . TRAMADOL HCL PO Take by mouth.         No current facility-administered medications on file prior to visit.    Allergies  Allergen Reactions  . Ketek [Telithromycin]    No family history on file. Mother has hypothyroidism BP 120/76  Pulse 61  Temp(Src) 98.9 F (37.2 C) (Oral)  Ht 5\' 5"  (1.651 m)  Wt 168 lb (76.204 kg)  BMI 27.96 kg/m2  SpO2 94%  Review of Systems denies depression, hair loss, cramps, sob, memory loss, numbness, rhinorrhea, easy bruising, and syncope. She has lost a few lbs.   She has constipation, dry skin, myalgias, and blurry vision.     Objective:   Physical Exam VS: see vs page GEN: no distress HEAD: head: no deformity eyes: no periorbital  swelling, no proptosis external nose and ears are normal mouth: no lesion seen NECK: a healed scar is present.  i do not appreciate a nodule in the thyroid or elsewhere in the neck CHEST WALL: no deformity LUNGS:  Clear to auscultation CV: reg rate and rhythm, no murmur ABD: abdomen is soft, nontender.  no hepatosplenomegaly.  not distended.  no hernia MUSCULOSKELETAL: muscle bulk and strength are grossly normal.  no obvious joint swelling.  gait is normal and steady EXTEMITIES: no deformity.    no edema PULSES:  no carotid bruit NEURO:  cn 2-12 grossly intact.   readily moves all 4's.  sensation is intact to touch on all 4's SKIN:  Normal texture and temperature.  No rash or suspicious lesion is visible.   NODES:  None palpable at the neck PSYCH: alert, well-oriented.  Does not appear anxious nor depressed.  (i reviewed pathol report) Lab Results  Component Value Date   TSH 17.74* 04/30/2013      Assessment & Plan:  S/p thyroidectomy: a very small focus of papillary adenocarcinoma is found.  Although we will do f/u US, I told pt that  the surgery was most likely curative Postsurgical hypothyroidism: she needs increased rx Fatigue: possibly due to hypothyroidism.

## 2013-04-30 NOTE — Patient Instructions (Addendum)
blood tests are being requested for you today.  We'll contact you with results. Please come back for a follow-up appointment in 6-12 months, when you will be due for another ultrasound.

## 2013-05-02 MED ORDER — LEVOTHYROXINE SODIUM 100 MCG PO TABS: 100.0000 ug | ORAL_TABLET | Freq: Every day | ORAL | Status: DC

## 2013-05-03 ENCOUNTER — Other Ambulatory Visit: Payer: Self-pay

## 2013-05-03 LAB — PTH, INTACT AND CALCIUM
Calcium: 9.7 mg/dL (ref 8.4–10.5)
PTH: 17.7 pg/mL (ref 14.0–72.0)

## 2013-05-04 ENCOUNTER — Other Ambulatory Visit: Payer: Self-pay

## 2013-05-04 DIAGNOSIS — R202 Paresthesia of skin: Secondary | ICD-10-CM

## 2013-05-04 DIAGNOSIS — E890 Postprocedural hypothyroidism: Secondary | ICD-10-CM

## 2013-05-27 ENCOUNTER — Telehealth: Payer: Self-pay | Admitting: Endocrinology

## 2013-05-27 NOTE — Telephone Encounter (Signed)
Requested call back.  

## 2013-05-27 NOTE — Telephone Encounter (Signed)
please call patient: i received your results from dr Lum Keas. Please continue the same thyroid medication. i hope you feel well.

## 2013-05-27 NOTE — Telephone Encounter (Signed)
Pt informed and instructed to continue same thyroid medication.

## 2013-09-23 ENCOUNTER — Other Ambulatory Visit: Payer: Self-pay | Admitting: Orthopedic Surgery

## 2013-09-30 ENCOUNTER — Other Ambulatory Visit: Payer: Self-pay | Admitting: Orthopedic Surgery

## 2013-10-14 ENCOUNTER — Encounter (HOSPITAL_COMMUNITY): Payer: Self-pay | Admitting: Pharmacy Technician

## 2013-10-15 NOTE — Patient Instructions (Signed)
20     Your procedure is scheduled on:  Monday 10/25/2013  Report to [facility] Main Entrance and follow signs to Short Stay  At  [place] AM.  Call this number if you have problems the night before or morning of surgery:  [phone]   Remember:             IF YOU USE CPAP,BRING MASK AND TUBING AM OF SURGERY!             IF YOU DO NOT HAVE YOUR TYPE AND SCREEN DRAWN AT PRE-ADMIT APPOINTMENT, YOU WILL HAVE IT DRAWN AM OF SURGERY!   Do not eat food or drink liquids AFTER MIDNIGHT!  Take these medicines the morning of surgery with A SIP OF WATER: Zoloft, Levothyroxine, Omeprazole    [name] IS NOT RESPONSIBLE FOR ANY BELONGINGS OR VALUABLES BROUGHT TO HOSPITAL.  [name] Kitchen  Leave suitcase in the car. After surgery it may be brought to your room.  For patients admitted to the hospital, checkout time is 11:00 AM the day of              Discharge.    DO NOT WEAR  JEWELRY,MAKE-UP,LOTIONS,POWDERS,PERFUMES,CONTACTS , DENTURES OR BRIDGEWORK ,AND DO NOT WEAR FALSE EYELASHES                                    Patients discharged the day of surgery will not be allowed to drive home. If going home the same day of surgery, must have someone stay with you  first 24 hrs.at home and arrange for someone to drive you home from the [place]: N/A   Special Instructions:              Please read over the following fact sheets that you were given:             1. [place].[name]     [phone]                              [facility] - Preparing for Surgery Before surgery, you can play an important role.  Because skin is not sterile, your skin needs to be as free of germs as possible.  You can reduce the number of germs on your skin by washing with CHG (chlorahexidine gluconate) soap before surgery.  CHG is an antiseptic cleaner which kills  germs and bonds with the skin to continue killing germs even after washing. Please DO NOT use if you have an allergy to CHG or antibacterial soaps.  If your skin becomes reddened/irritated stop using the CHG and inform your nurse when you arrive at Short Stay. Do not shave (including legs and underarms) for at least 48 hours prior to the first CHG shower.  You may shave your face/neck. Please follow these instructions carefully:  1.  Shower with CHG  Soap the night before surgery and the  morning of Surgery.  2.  If you choose to wash your hair, wash your hair first as usual with your  normal  shampoo.  3.  After you shampoo, rinse your hair and body thoroughly to remove the  shampoo.                           4.  Use CHG as you would any other liquid soap.  You can apply chg directly  to the skin and wash                       Gently with a scrungie or clean washcloth.  5.  Apply the CHG Soap to your body ONLY FROM THE NECK DOWN.   Do not use on face/ open                           Wound or open sores. Avoid contact with eyes, ears mouth and genitals (private parts).                       Wash face,  Genitals (private parts) with your normal soap.             6.  Wash thoroughly, paying special attention to the area where your surgery  will be performed.  7.  Thoroughly rinse your body with warm water from the neck down.  8.  DO NOT shower/wash with your normal soap after using and rinsing off  the CHG Soap.                9.  Pat yourself dry with a clean towel.            10.  Wear clean pajamas.            11.  Place clean sheets on your bed the night of your first shower and do not  sleep with pets. Day of Surgery : Do not apply any lotions/deodorants the morning of surgery.  Please wear clean clothes to the hospital/surgery center.  FAILURE TO FOLLOW THESE INSTRUCTIONS MAY RESULT IN THE CANCELLATION OF YOUR SURGERY PATIENT SIGNATURE_________________________________  NURSE  SIGNATURE__________________________________  ________________________________________________________________________   Michele Glenn  An incentive spirometer is a tool that can help keep your lungs clear and active. This tool measures how well you are filling your lungs with each breath. Taking long deep breaths may help reverse or decrease the chance of developing breathing (pulmonary) problems (especially infection) following:  A long period of time when you are unable to move or be active. BEFORE THE PROCEDURE   If the spirometer includes an indicator to show your best effort, your nurse or respiratory therapist will set it to a desired goal.  If possible, sit up straight or lean slightly forward. Try not to slouch.  Hold the incentive spirometer in an upright position. INSTRUCTIONS FOR USE  1. Sit on the edge of your bed if possible, or sit up as far as you can in bed or on a chair. 2. Hold the incentive spirometer in an upright position. 3. Breathe out normally. 4. Place the mouthpiece in your mouth and seal your lips tightly around it. 5. Breathe in slowly and as deeply as possible, raising the piston or the ball toward the top of the column. 6. Hold your breath for  3-5 seconds or for as long as possible. Allow the piston or ball to fall to the bottom of the column. 7. Remove the mouthpiece from your mouth and breathe out normally. 8. Rest for a few seconds and repeat Steps 1 through 7 at least 10 times every 1-2 hours when you are awake. Take your time and take a few normal breaths between deep breaths. 9. The spirometer may include an indicator to show your best effort. Use the indicator as a goal to work toward during each repetition. 10. After each set of 10 deep breaths, practice coughing to be sure your lungs are clear. If you have an incision (the cut made at the time of surgery), support your incision when coughing by placing a pillow or rolled up towels firmly  against it. Once you are able to get out of bed, walk around indoors and cough well. You may stop using the incentive spirometer when instructed by your caregiver.  RISKS AND COMPLICATIONS  Take your time so you do not get dizzy or light-headed.  If you are in pain, you may need to take or ask for pain medication before doing incentive spirometry. It is harder to take a deep breath if you are having pain. AFTER USE  Rest and breathe slowly and easily.  It can be helpful to keep track of a log of your progress. Your caregiver can provide you with a simple table to help with this. If you are using the spirometer at home, follow these instructions: [place] IF:   You are having difficultly using the spirometer.  You have trouble using the spirometer as often as instructed.  Your pain medication is not giving enough relief while using the spirometer.  You develop fever of 100.5 F (38.1 C) or higher. SEEK IMMEDIATE MEDICAL CARE IF:   You cough up bloody sputum that had not been present before.  You develop fever of 102 F (38.9 C) or greater.  You develop worsening pain at or near the incision site. MAKE SURE YOU:   Understand these instructions.  Will watch your condition.  Will get help right away if you are not doing well or get worse. Document Released: 07/01/2006 Document Revised: 05/13/2011 Document Reviewed: 09/01/2006 [employer] Patient Information 2014 [employer], [place].   ________________________________________________________________________  WHAT IS A BLOOD TRANSFUSION? Blood Transfusion Information  A transfusion is the replacement of blood or some of its parts. Blood is made up of multiple cells which provide different functions.  Red blood cells carry oxygen and are used for blood loss replacement.  White blood cells fight against infection.  Platelets control bleeding.  Plasma helps clot blood.  Other blood products are available for  specialized needs, such as hemophilia or other clotting disorders. BEFORE THE TRANSFUSION  Who gives blood for transfusions?   Healthy volunteers who are fully evaluated to make sure their blood is safe. This is blood bank blood. Transfusion therapy is the safest it has ever been in the practice of medicine. Before blood is taken from a donor, a complete history is taken to make sure that person has no history of diseases nor engages in risky social behavior (examples are intravenous drug use or sexual activity with multiple partners). The donor's travel history is screened to minimize risk of transmitting infections, such as malaria. The donated blood is tested for signs of infectious diseases, such as HIV and hepatitis. The blood is then tested to be sure it is compatible with you in  order to minimize the chance of a transfusion reaction. If you or a relative donates blood, this is often done in anticipation of surgery and is not appropriate for emergency situations. It takes many days to process the donated blood. RISKS AND COMPLICATIONS Although transfusion therapy is very safe and saves many lives, the main dangers of transfusion include:   Getting an infectious disease.  Developing a transfusion reaction. This is an allergic reaction to something in the blood you were given. Every precaution is taken to prevent this. The decision to have a blood transfusion has been considered carefully by your caregiver before blood is given. Blood is not given unless the benefits outweigh the risks. AFTER THE TRANSFUSION  Right after receiving a blood transfusion, you will usually feel much better and more energetic. This is especially true if your red blood cells have gotten low (anemic). The transfusion raises the level of the red blood cells which carry oxygen, and this usually causes an energy increase.  The nurse administering the transfusion will monitor you carefully for complications. HOME CARE  INSTRUCTIONS  No special instructions are needed after a transfusion. You may find your energy is better. Speak with your caregiver about any limitations on activity for underlying diseases you may have. SEEK MEDICAL CARE IF:   Your condition is not improving after your transfusion.  You develop redness or irritation at the intravenous (IV) site. SEEK IMMEDIATE MEDICAL CARE IF:  Any of the following symptoms occur over the next 12 hours:  Shaking chills.  You have a temperature by mouth above 102 F (38.9 C), not controlled by medicine.  Chest, back, or muscle pain.  People around you feel you are not acting correctly or are confused.  Shortness of breath or difficulty breathing.  Dizziness and fainting.  You get a rash or develop hives.  You have a decrease in urine output.  Your urine turns a dark color or changes to pink, red, or brown. Any of the following symptoms occur over the next 10 days:  You have a temperature by mouth above 102 F (38.9 C), not controlled by medicine.  Shortness of breath.  Weakness after normal activity.  The white part of the eye turns yellow (jaundice).  You have a decrease in the amount of urine or are urinating less often.  Your urine turns a dark color or changes to pink, red, or brown. Document Released: 02/16/2000 Document Revised: 05/13/2011 Document Reviewed: 10/05/2007 [facility] Patient Information 2014 [place], [place].  _______________________________________________________________________

## 2013-10-18 ENCOUNTER — Encounter (HOSPITAL_COMMUNITY): Payer: Self-pay

## 2013-10-18 ENCOUNTER — Encounter (HOSPITAL_COMMUNITY)
Admission: RE | Admit: 2013-10-18 | Discharge: 2013-10-18 | Disposition: A | Discharge status: Home / Self Care | Payer: BC Managed Care – PPO | Source: Ambulatory Visit | Attending: Orthopedic Surgery | Admitting: Orthopedic Surgery

## 2013-10-18 DIAGNOSIS — M1710 Unilateral primary osteoarthritis, unspecified knee: Secondary | ICD-10-CM | POA: Insufficient documentation

## 2013-10-18 DIAGNOSIS — Z01818 Encounter for other preprocedural examination: Secondary | ICD-10-CM | POA: Diagnosis present

## 2013-10-18 HISTORY — DX: Headache: R51

## 2013-10-18 HISTORY — DX: Gastro-esophageal reflux disease without esophagitis: K21.9

## 2013-10-18 HISTORY — DX: Anxiety disorder, unspecified: F41.9

## 2013-10-18 HISTORY — DX: Unspecified osteoarthritis, unspecified site: M19.90

## 2013-10-18 HISTORY — DX: Cervicalgia: M54.2

## 2013-10-18 HISTORY — DX: Hypothyroidism, unspecified: E03.9

## 2013-10-18 LAB — URINALYSIS, ROUTINE W REFLEX MICROSCOPIC
Bilirubin Urine: NEGATIVE
Glucose, UA: NEGATIVE mg/dL
Hgb urine dipstick: NEGATIVE
Ketones, ur: NEGATIVE mg/dL
LEUKOCYTES UA: NEGATIVE
Nitrite: NEGATIVE
Protein, ur: NEGATIVE mg/dL
SPECIFIC GRAVITY, URINE: 1.017 (ref 1.005–1.030)
Urobilinogen, UA: 0.2 mg/dL (ref 0.0–1.0)
pH: 7 (ref 5.0–8.0)

## 2013-10-18 LAB — CBC
HCT: 41.2 % (ref 36.0–46.0)
Hemoglobin: 14.1 g/dL (ref 12.0–15.0)
MCH: 30.6 pg (ref 26.0–34.0)
MCHC: 34.2 g/dL (ref 30.0–36.0)
MCV: 89.4 fL (ref 78.0–100.0)
PLATELETS: 221 10*3/uL (ref 150–400)
RBC: 4.61 MIL/uL (ref 3.87–5.11)
RDW: 12.1 % (ref 11.5–15.5)
WBC: 5.4 10*3/uL (ref 4.0–10.5)

## 2013-10-18 LAB — COMPREHENSIVE METABOLIC PANEL
ALBUMIN: 4.1 g/dL (ref 3.5–5.2)
ALT: 26 U/L (ref 0–35)
AST: 26 U/L (ref 0–37)
Alkaline Phosphatase: 103 U/L (ref 39–117)
Anion gap: 11 (ref 5–15)
BUN: 12 mg/dL (ref 6–23)
CHLORIDE: 99 meq/L (ref 96–112)
CO2: 28 mEq/L (ref 19–32)
CREATININE: 0.61 mg/dL (ref 0.50–1.10)
Calcium: 9.9 mg/dL (ref 8.4–10.5)
GFR calc Af Amer: >90 mL/min (ref >90)
GFR calc non Af Amer: >90 mL/min (ref >90)
Glucose, Bld: 98 mg/dL (ref 70–99)
Potassium: 4.7 mEq/L (ref 3.7–5.3)
Sodium: 138 mEq/L (ref 137–147)
TOTAL PROTEIN: 7.7 g/dL (ref 6.0–8.3)
Total Bilirubin: 0.5 mg/dL (ref 0.3–1.2)

## 2013-10-18 LAB — PROTIME-INR
INR: 1 (ref 0.00–1.49)
PROTHROMBIN TIME: 13.2 s (ref 11.6–15.2)

## 2013-10-18 LAB — SURGICAL PCR SCREEN
MRSA, PCR: NEGATIVE
Staphylococcus aureus: NEGATIVE

## 2013-10-18 LAB — APTT: aPTT: 33 seconds (ref 24–37)

## 2013-10-18 NOTE — Progress Notes (Signed)
12/28/12-Regadenoson Nuclear Stress Test from Dr. Geraldo Pitter on chart

## 2013-10-22 DIAGNOSIS — M5030 Other cervical disc degeneration, unspecified cervical region: Secondary | ICD-10-CM | POA: Insufficient documentation

## 2013-10-24 ENCOUNTER — Ambulatory Visit: Payer: Self-pay | Admitting: Orthopedic Surgery

## 2013-10-24 ENCOUNTER — Encounter (HOSPITAL_COMMUNITY): Payer: Self-pay | Admitting: Anesthesiology

## 2013-10-24 NOTE — Anesthesia Preprocedure Evaluation (Addendum)
Anesthesia Evaluation  Patient identified by MRN, date of birth, ID band Patient awake    Reviewed: Allergy & Precautions, H&P , NPO status , Patient's Chart, lab work & pertinent test results  Airway Mallampati: II TM Distance: >3 FB Neck ROM: Full    Dental no notable dental hx.    Pulmonary neg pulmonary ROS,  breath sounds clear to auscultation  Pulmonary exam normal       Cardiovascular negative cardio ROS  Rhythm:Regular Rate:Normal     Neuro/Psych  Headaches, Anxiety    GI/Hepatic Neg liver ROS, GERD-  Medicated,  Endo/Other  Hypothyroidism   Renal/GU negative Renal ROS  negative genitourinary   Musculoskeletal negative musculoskeletal ROS (+)   Abdominal   Peds negative pediatric ROS (+)  Hematology negative hematology ROS (+)   Anesthesia Other Findings   Reproductive/Obstetrics negative OB ROS                           Anesthesia Physical Anesthesia Plan  ASA: II  Anesthesia Plan: Spinal   Post-op Pain Management:    Induction: Intravenous  Airway Management Planned:   Additional Equipment:   Intra-op Plan:   Post-operative Plan:   Informed Consent: I have reviewed the patients History and Physical, chart, labs and discussed the procedure including the risks, benefits and alternatives for the proposed anesthesia with the patient or authorized representative who has indicated his/her understanding and acceptance.   Dental advisory given  Plan Discussed with: CRNA  Anesthesia Plan Comments: (Discussed risks/benefits of spinal including headache, backache, failure, bleeding, infection, and nerve damage. Patient consents to spinal. Questions answered. Coagulation studies and platelet count acceptable.)       Anesthesia Quick Evaluation

## 2013-10-24 NOTE — H&P (Signed)
Michele Glenn DOB: 04/20/1955 Married / Language: English / Race: White Female Date of Admission:  10-25-2013 Chief Complaint:  Left Knee Pain History of Present Illness  The patient is a 58 year old female who comes in for a preoperative history and physical. The patient is scheduled for a left total knee arthroplasty to be performed by Dr. Dione Plover. Aluisio, MD at [facility] on 10/25/2013. The patient is a 58 year old female who presents for follow up of their knee. The patient is being followed for their bilateral knee pain and osteoarthritis. Symptoms reported today include: pain. The patient feels that they are doing poorly. The following medication has been used for pain control: none. The patient presents following Synvisc series. No improvement w/ the Synvisc series. Had more relief from the cortisone injections. Unfortunately, the Synvisc did not help. She still has significant discomfort in both knees, which is limiting her ability to do things that she desires. She said it is getting progressively more painful and progressively more dysfunctional. She has had cortisone and viscosupplements without any long lasting benefit. She is at a stage now where she wants to do what it is going to take to get her knee better and get her lifestyle better. She is ready to proceed with surgery at this time. They have been treated conservatively in the past for the above stated problem and despite conservative measures, they continue to have progressive pain and severe functional limitations and dysfunction. They have failed non-operative management including home exercise, medications, and injections. It is felt that they would benefit from undergoing total joint replacement. Risks and benefits of the procedure have been discussed with the patient and they elect to proceed with surgery. There are no active contraindications to surgery such as ongoing infection or rapidly progressive neurological  disease.   Allergies Ketek Pak *Anti-infective Agents - Misc.** Hives Contrast Media Ready-Box *MEDICAL DEVICES* Sneezing ( No problemes with topical betadine or iodine)  INTOLERANCE Morphine Sulfate (Concentrate) *ANALGESICS - OPIOID* Nausea, Vomiting.   Problem List/Past Medical Osteoarthritis of left knee (715.96  M17.9) Knee pain, bilateral (719.46  M25.561, M25.562) Primary osteoarthritis of both knees (715.16  M17.0) Goiter Thyroid Thyroid carcinoma (193  C73) Post Thyroidectomy Fibromyalgia Hypothyroidism Anxiety Disorder Degenerative Disc Disease Cervical Heart murmur   Family History  Cancer Father Deceased. age 46, Ruptured Abdominal Aortic Anerysum Mother Living, Breast cancer.  Social History Current work status unemployed Children 1 Alcohol use never consumed alcohol Tobacco / smoke exposure 01/05/2013: no Previously in rehab no Marital status married Drug/Alcohol Rehab (Currently) no Living situation live with spouse Illicit drug use no Exercise Exercises weekly; does individual sport Never consumed alcohol 01/05/2013: Never consumed alcohol Pain Contract no Tobacco use 01/05/2013 Not under pain contract No history of drug/alcohol rehab [place] POA  Medication History Aspirin EC (Oral) Specific dose unknown - Active. Levothyroxine Sodium (Oral) Specific dose unknown - Active. (100mg ) Sertraline HCl (Oral) Specific dose unknown - Active. Omeprazole (Oral) Specific dose unknown - Active. TraMADol HCl (50MG  Tablet, Oral) Active.  Past Surgical History Carpal Tunnel Repair right Hysterectomy partial (non-cancerous) Thyroidectomy; Total   Review of Systems  General Present- Fatigue. Not Present- Chills, Fever, Memory Loss, Night Sweats, Weight Gain and Weight Loss. Skin Not Present- Eczema, Hives, Itching, Lesions and Rash. HEENT Present- Blurred Vision  and Headache. Not Present- Dentures, Double Vision, Hearing Loss, Tinnitus and Visual Loss. Respiratory Not Present- Allergies, Chronic Cough, Coughing up blood, Shortness  of breath at rest and Shortness of breath with exertion. Cardiovascular Not Present- Chest Pain, Difficulty Breathing Lying Down, Murmur, Palpitations, Racing/skipping heartbeats and Swelling. Gastrointestinal Not Present- Abdominal Pain, Bloody Stool, Constipation, Diarrhea, Difficulty Swallowing, Heartburn, Jaundice, Loss of appetitie, Nausea and Vomiting. Female Genitourinary Not Present- Blood in Urine, Discharge, Flank Pain, Incontinence, Painful Urination, Urgency, Urinary frequency, Urinary Retention, Urinating at Night and Weak urinary stream. Musculoskeletal Present- Joint Swelling, Morning Stiffness and Muscle Weakness. Not Present- Back Pain, Joint Pain, Muscle Pain and Spasms. Neurological Not Present- Blackout spells, Difficulty with balance, Dizziness, Paralysis, Tremor and Weakness. Psychiatric Not Present- Insomnia.   Vitals Weight: 160 lb Height: 64in Weight was reported by patient. Height was reported by patient. Body Surface Area: 1.81 m Body Mass Index: 27.46 kg/m Pulse: 64 (Regular)  Resp.: 14 (Unlabored)  BP: 112/62 (Sitting, Right Arm, Standard)   Physical Exam General Mental Status -Alert, cooperative and good historian. General Appearance-pleasant, Not in acute distress. Orientation-Oriented X3. Build & Nutrition-Well nourished and Well developed.  Head and Neck Head-normocephalic, atraumatic . Neck Global Assessment - supple(horizontal healed incision from thyroidectomy), no bruit auscultated on the right, no bruit auscultated on the left.  Eye Vision-Wears corrective lenses. Pupil - Bilateral-PERR Motion - Bilateral-EOMI.  Chest and Lung Exam Auscultation Breath sounds - clear at anterior chest wall and clear at posterior chest wall. Adventitious sounds  - No Adventitious sounds.  Cardiovascular Auscultation Rhythm - Regular rate and rhythm. Heart Sounds - S1 WNL and S2 WNL. Murmurs & Other Heart Sounds - Auscultation of the heart reveals - No Murmurs.  Abdomen Palpation/Percussion Tenderness - Abdomen is non-tender to palpation. Rigidity (guarding) - Abdomen is soft. Auscultation Auscultation of the abdomen reveals - Bowel sounds normal.  Female Genitourinary Note: Not done, not pertinent to present illness   Musculoskeletal Note: Her hips show normal range of motion with no discomfort. Her knees show no effusion. There is marked crepitus on range of motion. Both knees range about 5 to 130. She is tender lateral greater than medial with no instability. Pulse, sensation, and motor intact.  X-RAYS: Reviewed her radiographs again. She has got severe patellofemoral arthritis in both knees with some lateral compartment spurring in both, worse on the left than the right.  Assessment & Plan Osteoarthritis of left knee (715.96  M17.9) Impression: Left Knee Current Plans  Pt Education - Total Knee Replacement: knee replacement Note:Plan is for a Left Total Knee Replacement by Dr. Wynelle Link.  Plan is to go home.  PCP - Dr. Ernestene Kiel  The patient will receive topical TXA (tranexamic acid) due to: Thyroid Carcinoma  Signed electronically by Joelene Millin, III PA-C

## 2013-10-25 ENCOUNTER — Encounter (HOSPITAL_COMMUNITY): Payer: Self-pay | Admitting: *Deleted

## 2013-10-25 ENCOUNTER — Encounter (HOSPITAL_COMMUNITY): Payer: BC Managed Care – PPO | Admitting: Anesthesiology

## 2013-10-25 ENCOUNTER — Inpatient Hospital Stay (HOSPITAL_COMMUNITY)
Admission: RE | Admit: 2013-10-25 | Discharge: 2013-10-27 | DRG: 470 | Disposition: A | Discharge status: Home Health | Payer: BC Managed Care – PPO | Source: Ambulatory Visit | Attending: Orthopedic Surgery | Admitting: Orthopedic Surgery

## 2013-10-25 ENCOUNTER — Encounter (HOSPITAL_COMMUNITY)
Admission: RE | Disposition: A | Discharge status: Home Health | Payer: Self-pay | Source: Ambulatory Visit | Attending: Orthopedic Surgery

## 2013-10-25 ENCOUNTER — Inpatient Hospital Stay (HOSPITAL_COMMUNITY): Payer: BC Managed Care – PPO | Admitting: Anesthesiology

## 2013-10-25 DIAGNOSIS — M179 Osteoarthritis of knee, unspecified: Secondary | ICD-10-CM

## 2013-10-25 DIAGNOSIS — D62 Acute posthemorrhagic anemia: Secondary | ICD-10-CM | POA: Diagnosis not present

## 2013-10-25 DIAGNOSIS — M1710 Unilateral primary osteoarthritis, unspecified knee: Secondary | ICD-10-CM | POA: Diagnosis present

## 2013-10-25 DIAGNOSIS — M1712 Unilateral primary osteoarthritis, left knee: Secondary | ICD-10-CM

## 2013-10-25 DIAGNOSIS — E890 Postprocedural hypothyroidism: Secondary | ICD-10-CM | POA: Diagnosis present

## 2013-10-25 DIAGNOSIS — Z6827 Body mass index (BMI) 27.0-27.9, adult: Secondary | ICD-10-CM | POA: Diagnosis not present

## 2013-10-25 DIAGNOSIS — Z96652 Presence of left artificial knee joint: Secondary | ICD-10-CM

## 2013-10-25 DIAGNOSIS — M25569 Pain in unspecified knee: Secondary | ICD-10-CM | POA: Diagnosis present

## 2013-10-25 DIAGNOSIS — K219 Gastro-esophageal reflux disease without esophagitis: Secondary | ICD-10-CM | POA: Diagnosis present

## 2013-10-25 HISTORY — DX: Osteoarthritis of knee, unspecified: M17.9

## 2013-10-25 HISTORY — PX: TOTAL KNEE ARTHROPLASTY: SHX125

## 2013-10-25 HISTORY — DX: Unilateral primary osteoarthritis, unspecified knee: M17.10

## 2013-10-25 LAB — TYPE AND SCREEN
ABO/RH(D): A NEG
ANTIBODY SCREEN: NEGATIVE

## 2013-10-25 LAB — ABO/RH: ABO/RH(D): A NEG

## 2013-10-25 SURGERY — ARTHROPLASTY, KNEE, TOTAL
Anesthesia: Spinal | Site: Knee | Laterality: Left

## 2013-10-25 MED ORDER — TRAMADOL HCL 50 MG PO TABS: 50.0000 mg | ORAL_TABLET | Freq: Four times a day (QID) | ORAL | Status: DC | PRN

## 2013-10-25 MED ORDER — DIPHENHYDRAMINE HCL 12.5 MG/5ML PO ELIX
12.5000 mg | ORAL_SOLUTION | ORAL | Status: DC | PRN
  Administered 2013-10-27 (×2): 25 mg via ORAL
  Filled 2013-10-25 (×2): qty 10

## 2013-10-25 MED ORDER — PROMETHAZINE HCL 25 MG/ML IJ SOLN: 6.2500 mg | INTRAMUSCULAR | Status: DC | PRN

## 2013-10-25 MED ORDER — METOCLOPRAMIDE HCL 5 MG PO TABS
5.0000 mg | ORAL_TABLET | Freq: Three times a day (TID) | ORAL | Status: DC | PRN
  Filled 2013-10-25: qty 2

## 2013-10-25 MED ORDER — SODIUM CHLORIDE 0.9 % IR SOLN
Status: DC | PRN
  Administered 2013-10-25: 1000 mL

## 2013-10-25 MED ORDER — ACETAMINOPHEN 325 MG PO TABS: 650.0000 mg | ORAL_TABLET | Freq: Four times a day (QID) | ORAL | Status: DC | PRN

## 2013-10-25 MED ORDER — PROPOFOL 10 MG/ML IV BOLUS
INTRAVENOUS | Status: AC
  Filled 2013-10-25: qty 20

## 2013-10-25 MED ORDER — 0.9 % SODIUM CHLORIDE (POUR BTL) OPTIME
TOPICAL | Status: DC | PRN
  Administered 2013-10-25: 1000 mL

## 2013-10-25 MED ORDER — CEFAZOLIN SODIUM-DEXTROSE 2-3 GM-% IV SOLR
2.0000 g | INTRAVENOUS | Status: AC
  Administered 2013-10-25: 2 g via INTRAVENOUS

## 2013-10-25 MED ORDER — KETOROLAC TROMETHAMINE 15 MG/ML IJ SOLN: 7.5000 mg | Freq: Four times a day (QID) | INTRAMUSCULAR | Status: AC | PRN

## 2013-10-25 MED ORDER — CHLORHEXIDINE GLUCONATE 4 % EX LIQD: 60.0000 mL | Freq: Once | CUTANEOUS | Status: DC

## 2013-10-25 MED ORDER — DOCUSATE SODIUM 100 MG PO CAPS
100.0000 mg | ORAL_CAPSULE | Freq: Two times a day (BID) | ORAL | Status: DC
  Administered 2013-10-25 – 2013-10-27 (×4): 100 mg via ORAL

## 2013-10-25 MED ORDER — LACTATED RINGERS IV SOLN
INTRAVENOUS | Status: DC | PRN
  Administered 2013-10-25 (×2): via INTRAVENOUS

## 2013-10-25 MED ORDER — TRANEXAMIC ACID 100 MG/ML IV SOLN
2000.0000 mg | Freq: Once | INTRAVENOUS | Status: DC
  Filled 2013-10-25: qty 20

## 2013-10-25 MED ORDER — ONDANSETRON HCL 4 MG/2ML IJ SOLN
4.0000 mg | Freq: Four times a day (QID) | INTRAMUSCULAR | Status: DC | PRN
  Administered 2013-10-25 – 2013-10-26 (×2): 4 mg via INTRAVENOUS
  Filled 2013-10-25 (×2): qty 2

## 2013-10-25 MED ORDER — MIDAZOLAM HCL 5 MG/5ML IJ SOLN
INTRAMUSCULAR | Status: DC | PRN
  Administered 2013-10-25: 2 mg via INTRAVENOUS

## 2013-10-25 MED ORDER — ACETAMINOPHEN 10 MG/ML IV SOLN
1000.0000 mg | Freq: Once | INTRAVENOUS | Status: AC
  Administered 2013-10-25: 1000 mg via INTRAVENOUS
  Filled 2013-10-25: qty 100

## 2013-10-25 MED ORDER — HYDROMORPHONE HCL PF 1 MG/ML IJ SOLN
0.5000 mg | INTRAMUSCULAR | Status: DC | PRN
Start: 2013-10-25 — End: 2013-10-27
  Administered 2013-10-25 – 2013-10-26 (×3): 1 mg via INTRAVENOUS
  Filled 2013-10-25 (×3): qty 1

## 2013-10-25 MED ORDER — METHOCARBAMOL 1000 MG/10ML IJ SOLN
500.0000 mg | Freq: Four times a day (QID) | INTRAMUSCULAR | Status: DC | PRN
  Administered 2013-10-25 – 2013-10-26 (×3): 500 mg via INTRAVENOUS
  Filled 2013-10-25 (×3): qty 5

## 2013-10-25 MED ORDER — BUPIVACAINE LIPOSOME 1.3 % IJ SUSP
INTRAMUSCULAR | Status: DC | PRN
  Administered 2013-10-25: 20 mL

## 2013-10-25 MED ORDER — FENTANYL CITRATE 0.05 MG/ML IJ SOLN
INTRAMUSCULAR | Status: DC | PRN
  Administered 2013-10-25: 50 ug via INTRAVENOUS

## 2013-10-25 MED ORDER — METHOCARBAMOL 500 MG PO TABS
500.0000 mg | ORAL_TABLET | Freq: Four times a day (QID) | ORAL | Status: DC | PRN
  Administered 2013-10-27: 500 mg via ORAL
  Filled 2013-10-25 (×2): qty 1

## 2013-10-25 MED ORDER — SODIUM CHLORIDE 0.9 % IJ SOLN
INTRAMUSCULAR | Status: DC | PRN
  Administered 2013-10-25: 30 mL

## 2013-10-25 MED ORDER — OXYCODONE HCL 5 MG PO TABS
5.0000 mg | ORAL_TABLET | ORAL | Status: DC | PRN
  Administered 2013-10-25 (×2): 5 mg via ORAL
  Administered 2013-10-25 – 2013-10-27 (×12): 10 mg via ORAL
  Filled 2013-10-25 (×2): qty 2
  Filled 2013-10-25 (×2): qty 1
  Filled 2013-10-25 (×10): qty 2

## 2013-10-25 MED ORDER — SERTRALINE HCL 50 MG PO TABS
50.0000 mg | ORAL_TABLET | Freq: Every day | ORAL | Status: DC
  Administered 2013-10-25 – 2013-10-26 (×2): 50 mg via ORAL
  Filled 2013-10-25 (×3): qty 1

## 2013-10-25 MED ORDER — PHENOL 1.4 % MT LIQD: 1.0000 | OROMUCOSAL | Status: DC | PRN

## 2013-10-25 MED ORDER — ACETAMINOPHEN 650 MG RE SUPP
650.0000 mg | Freq: Four times a day (QID) | RECTAL | Status: DC | PRN
Start: 2013-10-26 — End: 2013-10-27

## 2013-10-25 MED ORDER — HYDROMORPHONE HCL PF 1 MG/ML IJ SOLN: 0.2500 mg | INTRAMUSCULAR | Status: DC | PRN

## 2013-10-25 MED ORDER — MORPHINE SULFATE 10 MG/ML IJ SOLN: 1.0000 mg | INTRAMUSCULAR | Status: DC | PRN

## 2013-10-25 MED ORDER — DEXAMETHASONE SODIUM PHOSPHATE 10 MG/ML IJ SOLN
INTRAMUSCULAR | Status: AC
  Filled 2013-10-25: qty 1

## 2013-10-25 MED ORDER — LEVOTHYROXINE SODIUM 100 MCG PO TABS
100.0000 ug | ORAL_TABLET | Freq: Every day | ORAL | Status: DC
  Administered 2013-10-26 – 2013-10-27 (×2): 100 ug via ORAL
  Filled 2013-10-25 (×3): qty 1

## 2013-10-25 MED ORDER — SODIUM CHLORIDE 0.9 % IJ SOLN
INTRAMUSCULAR | Status: AC
  Filled 2013-10-25: qty 50

## 2013-10-25 MED ORDER — ACETAMINOPHEN 500 MG PO TABS
1000.0000 mg | ORAL_TABLET | Freq: Four times a day (QID) | ORAL | Status: AC
  Administered 2013-10-25 – 2013-10-26 (×4): 1000 mg via ORAL
  Filled 2013-10-25 (×4): qty 2

## 2013-10-25 MED ORDER — MORPHINE SULFATE 2 MG/ML IJ SOLN
1.0000 mg | INTRAMUSCULAR | Status: DC | PRN
Start: 2013-10-25 — End: 2013-10-25
  Administered 2013-10-25: 2 mg via INTRAVENOUS
  Filled 2013-10-25: qty 1

## 2013-10-25 MED ORDER — BUPIVACAINE IN DEXTROSE 0.75-8.25 % IT SOLN
INTRATHECAL | Status: DC | PRN
  Administered 2013-10-25: 2 mL via INTRATHECAL

## 2013-10-25 MED ORDER — ONDANSETRON HCL 4 MG PO TABS: 4.0000 mg | ORAL_TABLET | Freq: Four times a day (QID) | ORAL | Status: DC | PRN

## 2013-10-25 MED ORDER — BISACODYL 10 MG RE SUPP: 10.0000 mg | Freq: Every day | RECTAL | Status: DC | PRN

## 2013-10-25 MED ORDER — MIDAZOLAM HCL 2 MG/2ML IJ SOLN
INTRAMUSCULAR | Status: AC
  Filled 2013-10-25: qty 2

## 2013-10-25 MED ORDER — METOCLOPRAMIDE HCL 5 MG/ML IJ SOLN
5.0000 mg | Freq: Three times a day (TID) | INTRAMUSCULAR | Status: DC | PRN
  Administered 2013-10-26: 10 mg via INTRAVENOUS
  Filled 2013-10-25: qty 2

## 2013-10-25 MED ORDER — FLEET ENEMA 7-19 GM/118ML RE ENEM: 1.0000 | ENEMA | Freq: Once | RECTAL | Status: AC | PRN

## 2013-10-25 MED ORDER — PANTOPRAZOLE SODIUM 40 MG PO TBEC
80.0000 mg | DELAYED_RELEASE_TABLET | Freq: Every day | ORAL | Status: DC
  Filled 2013-10-25: qty 2

## 2013-10-25 MED ORDER — DEXAMETHASONE 6 MG PO TABS
10.0000 mg | ORAL_TABLET | Freq: Every day | ORAL | Status: AC
  Administered 2013-10-26: 10 mg via ORAL
  Filled 2013-10-25: qty 1

## 2013-10-25 MED ORDER — MENTHOL 3 MG MT LOZG: 1.0000 | LOZENGE | OROMUCOSAL | Status: DC | PRN

## 2013-10-25 MED ORDER — RIVAROXABAN 10 MG PO TABS
10.0000 mg | ORAL_TABLET | Freq: Every day | ORAL | Status: DC
  Administered 2013-10-26 – 2013-10-27 (×2): 10 mg via ORAL
  Filled 2013-10-25 (×3): qty 1

## 2013-10-25 MED ORDER — BUPIVACAINE LIPOSOME 1.3 % IJ SUSP
20.0000 mL | Freq: Once | INTRAMUSCULAR | Status: DC
  Filled 2013-10-25: qty 20

## 2013-10-25 MED ORDER — SODIUM CHLORIDE 0.9 % IV SOLN
INTRAVENOUS | Status: DC
  Administered 2013-10-25: 10:00:00 via INTRAVENOUS

## 2013-10-25 MED ORDER — DEXAMETHASONE SODIUM PHOSPHATE 10 MG/ML IJ SOLN
10.0000 mg | Freq: Every day | INTRAMUSCULAR | Status: AC
  Filled 2013-10-25: qty 1

## 2013-10-25 MED ORDER — POLYETHYLENE GLYCOL 3350 17 G PO PACK: 17.0000 g | PACK | Freq: Every day | ORAL | Status: DC | PRN

## 2013-10-25 MED ORDER — CEFAZOLIN SODIUM-DEXTROSE 2-3 GM-% IV SOLR
2.0000 g | Freq: Four times a day (QID) | INTRAVENOUS | Status: AC
  Administered 2013-10-25 (×2): 2 g via INTRAVENOUS
  Filled 2013-10-25 (×2): qty 50

## 2013-10-25 MED ORDER — PROPOFOL INFUSION 10 MG/ML OPTIME
INTRAVENOUS | Status: DC | PRN
  Administered 2013-10-25: 140 ug/kg/min via INTRAVENOUS

## 2013-10-25 MED ORDER — LACTATED RINGERS IV SOLN: INTRAVENOUS | Status: DC

## 2013-10-25 MED ORDER — DEXTROSE-NACL 5-0.9 % IV SOLN
INTRAVENOUS | Status: DC
  Administered 2013-10-25: 19:00:00 via INTRAVENOUS

## 2013-10-25 MED ORDER — BUPIVACAINE HCL (PF) 0.25 % IJ SOLN
INTRAMUSCULAR | Status: AC
  Filled 2013-10-25: qty 30

## 2013-10-25 MED ORDER — DEXAMETHASONE SODIUM PHOSPHATE 10 MG/ML IJ SOLN
10.0000 mg | Freq: Once | INTRAMUSCULAR | Status: AC
  Administered 2013-10-25: 10 mg via INTRAVENOUS

## 2013-10-25 MED ORDER — STERILE WATER FOR IRRIGATION IR SOLN
Status: DC | PRN
  Administered 2013-10-25: 1500 mL

## 2013-10-25 MED ORDER — FENTANYL CITRATE 0.05 MG/ML IJ SOLN
INTRAMUSCULAR | Status: AC
  Filled 2013-10-25: qty 2

## 2013-10-25 MED ORDER — PROPOFOL 10 MG/ML IV BOLUS
INTRAVENOUS | Status: DC | PRN
  Administered 2013-10-25: 30 mg via INTRAVENOUS

## 2013-10-25 MED ORDER — BUPIVACAINE HCL 0.25 % IJ SOLN
INTRAMUSCULAR | Status: DC | PRN
  Administered 2013-10-25: 20 mL

## 2013-10-25 MED ORDER — CEFAZOLIN SODIUM-DEXTROSE 2-3 GM-% IV SOLR
INTRAVENOUS | Status: AC
  Filled 2013-10-25: qty 50

## 2013-10-25 SURGICAL SUPPLY — 60 items
BAG SPEC THK2 15X12 ZIP CLS (MISCELLANEOUS) ×1
BAG ZIPLOCK 12X15 (MISCELLANEOUS) ×2 IMPLANT
BANDAGE ELASTIC 6 VELCRO ST LF (GAUZE/BANDAGES/DRESSINGS) ×2 IMPLANT
BANDAGE ESMARK 6X9 LF (GAUZE/BANDAGES/DRESSINGS) ×1 IMPLANT
BLADE SAG 18X100X1.27 (BLADE) ×2 IMPLANT
BLADE SAW SGTL 11.0X1.19X90.0M (BLADE) ×2 IMPLANT
BNDG CMPR 9X6 STRL LF SNTH (GAUZE/BANDAGES/DRESSINGS) ×1
BNDG ESMARK 6X9 LF (GAUZE/BANDAGES/DRESSINGS) ×2
BOWL SMART MIX CTS (DISPOSABLE) ×2 IMPLANT
CAP KNEE ATTUNE RP ×1 IMPLANT
CEMENT HV SMART SET (Cement) ×4 IMPLANT
CUFF TOURN SGL QUICK 34 (TOURNIQUET CUFF) ×2
CUFF TRNQT CYL 34X4X40X1 (TOURNIQUET CUFF) ×1 IMPLANT
DECANTER SPIKE VIAL GLASS SM (MISCELLANEOUS) ×2 IMPLANT
DRAPE EXTREMITY T 121X128X90 (DRAPE) ×2 IMPLANT
DRAPE POUCH INSTRU U-SHP 10X18 (DRAPES) ×2 IMPLANT
DRAPE U-SHAPE 47X51 STRL (DRAPES) ×2 IMPLANT
DRSG ADAPTIC 3X8 NADH LF (GAUZE/BANDAGES/DRESSINGS) ×2 IMPLANT
DRSG PAD ABDOMINAL 8X10 ST (GAUZE/BANDAGES/DRESSINGS) ×2 IMPLANT
DURAPREP 26ML APPLICATOR (WOUND CARE) ×2 IMPLANT
ELECT REM PT RETURN 9FT ADLT (ELECTROSURGICAL) ×2
ELECTRODE REM PT RTRN 9FT ADLT (ELECTROSURGICAL) ×1 IMPLANT
EVACUATOR 1/8 PVC DRAIN (DRAIN) ×2 IMPLANT
FACESHIELD WRAPAROUND (MASK) ×10 IMPLANT
FACESHIELD WRAPAROUND OR TEAM (MASK) ×5 IMPLANT
GAUZE SPONGE 4X4 12PLY STRL (GAUZE/BANDAGES/DRESSINGS) ×2 IMPLANT
GLOVE BIO SURGEON STRL SZ7.5 (GLOVE) IMPLANT
GLOVE BIO SURGEON STRL SZ8 (GLOVE) ×2 IMPLANT
GLOVE BIOGEL PI IND STRL 6.5 (GLOVE) IMPLANT
GLOVE BIOGEL PI IND STRL 8 (GLOVE) ×1 IMPLANT
GLOVE BIOGEL PI INDICATOR 6.5 (GLOVE)
GLOVE BIOGEL PI INDICATOR 8 (GLOVE) ×1
GLOVE SURG SS PI 6.5 STRL IVOR (GLOVE) IMPLANT
GOWN STRL REUS W/TWL LRG LVL3 (GOWN DISPOSABLE) ×2 IMPLANT
GOWN STRL REUS W/TWL XL LVL3 (GOWN DISPOSABLE) IMPLANT
HANDPIECE INTERPULSE COAX TIP (DISPOSABLE) ×2
IMMOBILIZER KNEE 20 (SOFTGOODS) ×2
IMMOBILIZER KNEE 20 THIGH 36 (SOFTGOODS) ×1 IMPLANT
KIT BASIN OR (CUSTOM PROCEDURE TRAY) ×2 IMPLANT
MANIFOLD NEPTUNE II (INSTRUMENTS) ×2 IMPLANT
NDL SAFETY ECLIPSE 18X1.5 (NEEDLE) ×2 IMPLANT
NEEDLE HYPO 18GX1.5 SHARP (NEEDLE) ×4
NS IRRIG 1000ML POUR BTL (IV SOLUTION) ×2 IMPLANT
PACK TOTAL JOINT (CUSTOM PROCEDURE TRAY) ×2 IMPLANT
PADDING CAST COTTON 6X4 STRL (CAST SUPPLIES) ×4 IMPLANT
POSITIONER SURGICAL ARM (MISCELLANEOUS) ×2 IMPLANT
SET HNDPC FAN SPRY TIP SCT (DISPOSABLE) ×1 IMPLANT
STRIP CLOSURE SKIN 1/2X4 (GAUZE/BANDAGES/DRESSINGS) ×4 IMPLANT
SUCTION FRAZIER 12FR DISP (SUCTIONS) ×2 IMPLANT
SUT MNCRL AB 4-0 PS2 18 (SUTURE) ×2 IMPLANT
SUT VIC AB 2-0 CT1 27 (SUTURE) ×6
SUT VIC AB 2-0 CT1 TAPERPNT 27 (SUTURE) ×3 IMPLANT
SUT VLOC 180 0 24IN GS25 (SUTURE) ×2 IMPLANT
SYRINGE 20CC LL (MISCELLANEOUS) ×2 IMPLANT
SYRINGE 60CC LL (MISCELLANEOUS) ×2 IMPLANT
TOWEL OR 17X26 10 PK STRL BLUE (TOWEL DISPOSABLE) ×2 IMPLANT
TOWEL OR NON WOVEN STRL DISP B (DISPOSABLE) IMPLANT
TRAY FOLEY CATH 14FRSI W/METER (CATHETERS) ×2 IMPLANT
WATER STERILE IRR 1500ML POUR (IV SOLUTION) ×2 IMPLANT
WRAP KNEE MAXI GEL POST OP (GAUZE/BANDAGES/DRESSINGS) ×2 IMPLANT

## 2013-10-25 NOTE — Plan of Care (Signed)
Problem: Consults Goal: Diagnosis- Total Joint Replacement Primary Total Knee     

## 2013-10-25 NOTE — Anesthesia Postprocedure Evaluation (Signed)
  Anesthesia Post-op Note  Patient: Michele Glenn  Procedure(s) Performed: Procedure(s) (LRB): LEFT TOTAL KNEE ARTHROPLASTY (Left)  Patient Location: PACU  Anesthesia Type: Spinal  Level of Consciousness: awake and alert   Airway and Oxygen Therapy: Patient Spontanous Breathing  Post-op Pain: mild  Post-op Assessment: Post-op Vital signs reviewed, Patient's Cardiovascular Status Stable, Respiratory Function Stable, Patent Airway and No signs of Nausea or vomiting  Last Vitals:  Filed Vitals:   10/25/13 1206  BP: 125/53  Pulse: 79  Temp: 36.8 C  Resp: 16    Post-op Vital Signs: stable   Complications: No apparent anesthesia complications

## 2013-10-25 NOTE — Transfer of Care (Signed)
Immediate Anesthesia Transfer of Care Note  Patient: Michele Glenn  Procedure(s) Performed: Procedure(s): LEFT TOTAL KNEE ARTHROPLASTY (Left)  Patient Location: PACU  Anesthesia Type:Regional and Spinal  Level of Consciousness: awake, alert  and patient cooperative  Airway & Oxygen Therapy: Patient Spontanous Breathing, Patient connected to face mask oxygen and Patient connected to T-piece oxygen  Post-op Assessment: Report given to PACU RN and Post -op Vital signs reviewed and stable  Post vital signs: Reviewed and stable  Complications: No apparent anesthesia complications

## 2013-10-25 NOTE — Evaluation (Signed)
Physical Therapy Evaluation Patient Details Name: Michele Glenn MRN: [id number] DOB: April 15, 1955 Today's Date: 10/25/2013   History of Present Illness  LTKA  Clinical Impression  *Pt ambulated x 50', felt  Back relief to be OOB. Pt will benefit from PT to address problems listed in note below.   Follow Up Recommendations Home health PT;Supervision/Assistance - 24 hour    Equipment Recommendations  None recommended by PT    Recommendations for Other Services       Precautions / Restrictions Precautions Precautions: Knee Required Braces or Orthoses: Knee Immobilizer - Left      Mobility  Bed Mobility Overal bed mobility: Needs Assistance Bed Mobility: Supine to Sit     Supine to sit: Min guard     General bed mobility comments: cues for technique  Transfers Overall transfer level: Needs assistance Equipment used: Rolling [place] (2 wheeled) Transfers: Sit to/from Stand Sit to Stand: Min assist;From elevated surface         General transfer comment: cues for hand and LLE position/safety  Ambulation/Gait Ambulation/Gait assistance: Min assist Ambulation Distance (Feet): 50 Feet Assistive device: Rolling [place] (2 wheeled) Gait Pattern/deviations: Step-to pattern;Decreased step length - left;Decreased stance time - left     General Gait Details: cues for sequence and posture  Stairs            Wheelchair Mobility    Modified Rankin (Stroke Patients Only)       Balance                                             Pertinent Vitals/Pain Pain Assessment: 0-10 Pain Score: 4  Pain Descriptors / Indicators: Aching Pain Intervention(s): Monitored during session;Premedicated before session;Ice applied    Home Living Family/patient expects to be discharged to:: Private residence Living Arrangements: Spouse/significant other Available Help at Discharge: Family Type of Home: House Home Access: Stairs to enter Entrance Stairs-Rails:  None Entrance Stairs-Number of Steps: 2   Home Equipment: Environmental consultant - 2 wheels;Bedside commode;Cane - single point      Prior Function Level of Independence: Independent               Hand Dominance        Extremity/Trunk Assessment   Upper Extremity Assessment: Overall WFL for tasks assessed           Lower Extremity Assessment: LLE deficits/detail   LLE Deficits / Details: able to lift leg from bed     Communication   Communication: No difficulties  Cognition Arousal/Alertness: Awake/alert Behavior During Therapy: WFL for tasks assessed/performed Overall Cognitive Status: Within Functional Limits for tasks assessed                      General Comments      Exercises        Assessment/Plan    PT Assessment Patient needs continued PT services  PT Diagnosis Difficulty walking;Acute pain   PT Problem List Decreased strength;Decreased range of motion;Decreased activity tolerance;Decreased mobility;Decreased knowledge of precautions;Decreased safety awareness;Decreased knowledge of use of DME;Pain  PT Treatment Interventions DME instruction;Gait training;Stair training;Functional mobility training;Therapeutic activities;Therapeutic exercise;Patient/family education   PT Goals (Current goals can be found in the Care Plan section) Acute Rehab PT Goals Patient Stated Goal: to get the Right one done PT Goal Formulation: With patient/family Time For Goal Achievement: 10/29/13 Potential to Achieve  Goals: Good    Frequency 7X/week   Barriers to discharge        Co-evaluation               End of Session Equipment Utilized During Treatment: Left knee immobilizer Activity Tolerance: Patient tolerated treatment well Patient left: in chair;with call bell/phone within reach;with family/visitor present Nurse Communication: Mobility status         Time: 1637-1700 PT Time Calculation (min): 23 min   Charges:   PT Evaluation $Initial PT  Evaluation Tier I: 1 Procedure PT Treatments $Gait Training: 23-37 mins   PT G CodesClaretha Cooper 10/25/2013, 5:36 PM Tresa Endo PT [phone]

## 2013-10-25 NOTE — Anesthesia Procedure Notes (Signed)
Spinal  Patient location during procedure: OR Staffing Anesthesiologist: Salley Scarlet Performed by: anesthesiologist  Preanesthetic Checklist Completed: patient identified, site marked, surgical consent, pre-op evaluation, timeout performed, IV checked, risks and benefits discussed and monitors and equipment checked Spinal Block Patient position: sitting Prep: ChloraPrep Patient monitoring: heart rate, continuous pulse ox and blood pressure Approach: midline Location: L3-4 Injection technique: single-shot Needle Needle type: Sprotte  Needle gauge: 24 G Needle length: 9 cm Additional Notes Expiration date of kit checked and confirmed. Patient tolerated procedure well, without complications.    No paresthesia. CSF clear. Chloraprep dry at time of spinal introducer.

## 2013-10-25 NOTE — H&P (View-Only) (Signed)
Michele Glenn DOB: [birth year] Married / Language: English / Race: White Female Date of Admission:  10-25-2013 Chief Complaint:  Left Knee Pain History of Present Illness  The patient is a 58 year old female who comes in for a preoperative history and physical. The patient is scheduled for a left total knee arthroplasty to be performed by Dr. Dione Plover. Aluisio, MD at [facility] on 10/25/2013. The patient is a 58 year old female who presents for follow up of their knee. The patient is being followed for their bilateral knee pain and osteoarthritis. Symptoms reported today include: pain. The patient feels that they are doing poorly. The following medication has been used for pain control: none. The patient presents following Synvisc series. No improvement w/ the Synvisc series. Had more relief from the cortisone injections. Unfortunately, the Synvisc did not help. She still has significant discomfort in both knees, which is limiting her ability to do things that she desires. She said it is getting progressively more painful and progressively more dysfunctional. She has had cortisone and viscosupplements without any long lasting benefit. She is at a stage now where she wants to do what it is going to take to get her knee better and get her lifestyle better. She is ready to proceed with surgery at this time. They have been treated conservatively in the past for the above stated problem and despite conservative measures, they continue to have progressive pain and severe functional limitations and dysfunction. They have failed non-operative management including home exercise, medications, and injections. It is felt that they would benefit from undergoing total joint replacement. Risks and benefits of the procedure have been discussed with the patient and they elect to proceed with surgery. There are no active contraindications to surgery such as ongoing infection or rapidly progressive neurological  disease.   Allergies Ketek Pak *Anti-infective Agents - Misc.** Hives Contrast Media Ready-Box *MEDICAL DEVICES* Sneezing ( No problemes with topical betadine or iodine)  INTOLERANCE Morphine Sulfate (Concentrate) *ANALGESICS - OPIOID* Nausea, Vomiting.   Problem List/Past Medical Osteoarthritis of left knee (715.96  M17.9) Knee pain, bilateral (719.46  M25.561, M25.562) Primary osteoarthritis of both knees (715.16  M17.0) Goiter Thyroid Thyroid carcinoma (193  C73) Post Thyroidectomy Fibromyalgia Hypothyroidism Anxiety Disorder Degenerative Disc Disease Cervical Heart murmur   Family History  Cancer Father Deceased. age 54, Ruptured Abdominal Aortic Anerysum Mother Living, Breast cancer.  Social History Current work status unemployed Children 1 Alcohol use never consumed alcohol Tobacco / smoke exposure 01/05/2013: no Previously in rehab no Marital status married Drug/Alcohol Rehab (Currently) no Living situation live with spouse Illicit drug use no Exercise Exercises weekly; does individual sport Never consumed alcohol 01/05/2013: Never consumed alcohol Pain Contract no Tobacco use 01/05/2013 Not under pain contract No history of drug/alcohol rehab [place] POA  Medication History Aspirin EC (Oral) Specific dose unknown - Active. Levothyroxine Sodium (Oral) Specific dose unknown - Active. (100mg ) Sertraline HCl (Oral) Specific dose unknown - Active. Omeprazole (Oral) Specific dose unknown - Active. TraMADol HCl (50MG  Tablet, Oral) Active.  Past Surgical History Carpal Tunnel Repair right Hysterectomy partial (non-cancerous) Thyroidectomy; Total   Review of Systems  General Present- Fatigue. Not Present- Chills, Fever, Memory Loss, Night Sweats, Weight Gain and Weight Loss. Skin Not Present- Eczema, Hives, Itching, Lesions and Rash. HEENT Present- Blurred Vision  and Headache. Not Present- Dentures, Double Vision, Hearing Loss, Tinnitus and Visual Loss. Respiratory Not Present- Allergies, Chronic Cough, Coughing up blood, Shortness  of breath at rest and Shortness of breath with exertion. Cardiovascular Not Present- Chest Pain, Difficulty Breathing Lying Down, Murmur, Palpitations, Racing/skipping heartbeats and Swelling. Gastrointestinal Not Present- Abdominal Pain, Bloody Stool, Constipation, Diarrhea, Difficulty Swallowing, Heartburn, Jaundice, Loss of appetitie, Nausea and Vomiting. Female Genitourinary Not Present- Blood in Urine, Discharge, Flank Pain, Incontinence, Painful Urination, Urgency, Urinary frequency, Urinary Retention, Urinating at Night and Weak urinary stream. Musculoskeletal Present- Joint Swelling, Morning Stiffness and Muscle Weakness. Not Present- Back Pain, Joint Pain, Muscle Pain and Spasms. Neurological Not Present- Blackout spells, Difficulty with balance, Dizziness, Paralysis, Tremor and Weakness. Psychiatric Not Present- Insomnia.   Vitals Weight: 160 lb Height: 64in Weight was reported by patient. Height was reported by patient. Body Surface Area: 1.81 m Body Mass Index: 27.46 kg/m Pulse: 64 (Regular)  Resp.: 14 (Unlabored)  BP: 112/62 (Sitting, Right Arm, Standard)   Physical Exam General Mental Status -Alert, cooperative and good historian. General Appearance-pleasant, Not in acute distress. Orientation-Oriented X3. Build & Nutrition-Well nourished and Well developed.  Head and Neck Head-normocephalic, atraumatic . Neck Global Assessment - supple(horizontal healed incision from thyroidectomy), no bruit auscultated on the right, no bruit auscultated on the left.  Eye Vision-Wears corrective lenses. Pupil - Bilateral-PERR Motion - Bilateral-EOMI.  Chest and Lung Exam Auscultation Breath sounds - clear at anterior chest wall and clear at posterior chest wall. Adventitious sounds  - No Adventitious sounds.  Cardiovascular Auscultation Rhythm - Regular rate and rhythm. Heart Sounds - S1 WNL and S2 WNL. Murmurs & Other Heart Sounds - Auscultation of the heart reveals - No Murmurs.  Abdomen Palpation/Percussion Tenderness - Abdomen is non-tender to palpation. Rigidity (guarding) - Abdomen is soft. Auscultation Auscultation of the abdomen reveals - Bowel sounds normal.  Female Genitourinary Note: Not done, not pertinent to present illness   Musculoskeletal Note: Her hips show normal range of motion with no discomfort. Her knees show no effusion. There is marked crepitus on range of motion. Both knees range about 5 to 130. She is tender lateral greater than medial with no instability. Pulse, sensation, and motor intact.  X-RAYS: Reviewed her radiographs again. She has got severe patellofemoral arthritis in both knees with some lateral compartment spurring in both, worse on the left than the right.  Assessment & Plan Osteoarthritis of left knee (715.96  M17.9) Impression: Left Knee Current Plans  Pt Education - Total Knee Replacement: knee replacement Note:Plan is for a Left Total Knee Replacement by Dr. Wynelle Link.  Plan is to go home.  PCP - Dr. Ernestene Kiel  The patient will receive topical TXA (tranexamic acid) due to: Thyroid Carcinoma  Signed electronically by Joelene Millin, III PA-C

## 2013-10-25 NOTE — Op Note (Signed)
Pre-operative diagnosis- Osteoarthritis  Left knee(s)  Post-operative diagnosis- Osteoarthritis Left knee(s)  Procedure-  Left  Total Knee Arthroplasty (Attune system)  Surgeon- Dione Plover. Kadeen Sroka, MD  Assistant- Ardeen Jourdain, PA-C   Anesthesia-  Spinal  EBL-* No blood loss amount entered *   Drains Hemovac  Tourniquet time-  Total Tourniquet Time Documented: Thigh (Left) - 33 minutes Total: Thigh (Left) - 33 minutes     Complications- None  Condition-PACU - hemodynamically stable.   Brief Clinical Note  Michele Glenn is a 58 y.o. year old female with end stage OA of her left knee with progressively worsening pain and dysfunction. She has constant pain, with activity and at rest and significant functional deficits with difficulties even with ADLs. She has had extensive non-op management including analgesics, injections of cortisone and viscosupplements, and home exercise program, but remains in significant pain with significant dysfunction. Radiographs show bone on bone arthritis patellofemoral with significant lateral narrowing. She presents now for left Total Knee Arthroplasty.    Procedure in detail---   The patient is brought into the operating room and positioned supine on the operating table. After successful administration of  Spinal,   a tourniquet is placed high on the  Left thigh(s) and the lower extremity is prepped and draped in the usual sterile fashion. Time out is performed by the operating team and then the  Left lower extremity is wrapped in Esmarch, knee flexed and the tourniquet inflated to 300 mmHg.       A midline incision is made with a ten blade through the subcutaneous tissue to the level of the extensor mechanism. A fresh blade is used to make a medial parapatellar arthrotomy. Soft tissue over the proximal medial tibia is subperiosteally elevated to the joint line with a knife and into the semimembranosus bursa with a Cobb elevator. Soft tissue over the  proximal lateral tibia is elevated with attention being paid to avoiding the patellar tendon on the tibial tubercle. The patella is everted, knee flexed 90 degrees and the ACL and PCL are removed. Findings are bone on bone patellofemoral with exposed bone lateral and large global osteophytes.        The drill is used to create a starting hole in the distal femur and the canal is thoroughly irrigated with sterile saline to remove the fatty contents. The 5 degree Left  valgus alignment guide is placed into the femoral canal and the distal femoral cutting block is pinned to remove 9 mm off the distal femur. Resection is made with an oscillating saw.      The tibia is subluxed forward and the menisci are removed. The extramedullary alignment guide is placed referencing proximally at the medial aspect of the tibial tubercle and distally along the second metatarsal axis and tibial crest. The block is pinned to remove 26mm off the more deficient medial  side. Resection is made with an oscillating saw. Size 4is the most appropriate size for the tibia and the proximal tibia is prepared with the modular drill and keel punch for that size.      The femoral sizing guide is placed and size 4 is most appropriate. Rotation is marked off the epicondylar axis and confirmed by creating a rectangular flexion gap at 90 degrees. The size 4 cutting block is pinned in this rotation and the anterior, posterior and chamfer cuts are made with the oscillating saw. The intercondylar block is then placed and that cut is made.  Trial size 4 tibial component, trial size 4 narrow posterior stabilized femur and a 6  mm posterior stabilized rotating platform insert trial is placed. Full extension is achieved with excellent varus/valgus and anterior/posterior balance throughout full range of motion. The patella is everted and thickness measured to be 22  mm. Free hand resection is taken to 12 mm, a 35 template is placed, lug holes are drilled,  trial patella is placed, and it tracks normally. Osteophytes are removed off the posterior femur with the trial in place. All trials are removed and the cut bone surfaces prepared with pulsatile lavage. Cement is mixed and once ready for implantation, the size 4 tibial implant, size  4 narrow posterior stabilized femoral component, and the size 35 patella are cemented in place and the patella is held with the clamp. The trial insert is placed and the knee held in full extension. The Exparel (20 ml mixed with 30 ml saline) and .25% Bupivicaine, are injected into the extensor mechanism, posterior capsule, medial and lateral gutters and subcutaneous tissues.  All extruded cement is removed and once the cement is hard the permanent 6 mm posterior stabilized rotating platform insert is placed into the tibial tray.      The wound is copiously irrigated with saline solution and the extensor mechanism closed over a hemovac drain with #1 V-loc suture. The tourniquet is released for a total tourniquet time of 33  minutes. Flexion against gravity is 140 degrees and the patella tracks normally. Subcutaneous tissue is closed with 2.0 vicryl and subcuticular with running 4.0 Monocryl. The incision is cleaned and dried and steri-strips and a bulky sterile dressing are applied. The limb is placed into a knee immobilizer and the patient is awakened and transported to recovery in stable condition.      Please note that a surgical assistant was a medical necessity for this procedure in order to perform it in a safe and expeditious manner. Surgical assistant was necessary to retract the ligaments and vital neurovascular structures to prevent injury to them and also necessary for proper positioning of the limb to allow for anatomic placement of the prosthesis.   Dione Plover Hawthorne Day, MD    10/25/2013, 8:15 AM

## 2013-10-25 NOTE — Progress Notes (Signed)
CARE MANAGEMENT NOTE 10/25/2013  Patient:  Michele Glenn, Michele Glenn   Account Number:  [id number]  Date Initiated:  10/25/2013  Documentation initiated by:  Naisha Wisdom  Subjective/Objective Assessment:   total knee     Action/Plan:   home when stable   Anticipated DC Date:  10/28/2013   Anticipated DC Plan:    In-house referral  NA      DC Planning Services  CM consult      Choice offered to / List presented to:             Status of service:  In process, will continue to follow Medicare Important Message given?  NA - LOS <3 / Initial given by admissions (If response is "NO", the following Medicare IM given date fields will be blank) Date Medicare IM given:   Medicare IM given by:   Date Additional Medicare IM given:   Additional Medicare IM given by:    Discharge Disposition:    Per UR Regulation:  Reviewed for med. necessity/level of care/duration of stay  If discussed at [place] of Stay Meetings, dates discussed:    Comments:  Suanne Marker Afreen Siebels,RN,BSN,CCM

## 2013-10-25 NOTE — Interval H&P Note (Signed)
History and Physical Interval Note:  10/25/2013 6:43 AM  Michele Glenn  has presented today for surgery, with the diagnosis of OA LEFT KNEE  The various methods of treatment have been discussed with the patient and family. After consideration of risks, benefits and other options for treatment, the patient has consented to  Procedure(s): LEFT TOTAL KNEE ARTHROPLASTY (Left) as a surgical intervention .  The patient's history has been reviewed, patient examined, no change in status, stable for surgery.  I have reviewed the patient's chart and labs.  Questions were answered to the patient's satisfaction.     Gearlean Alf

## 2013-10-26 ENCOUNTER — Encounter (HOSPITAL_COMMUNITY): Payer: Self-pay | Admitting: Orthopedic Surgery

## 2013-10-26 DIAGNOSIS — D62 Acute posthemorrhagic anemia: Secondary | ICD-10-CM | POA: Diagnosis not present

## 2013-10-26 LAB — CBC
HCT: 30.9 % — ABNORMAL LOW (ref 36.0–46.0)
Hemoglobin: 10.4 g/dL — ABNORMAL LOW (ref 12.0–15.0)
MCH: 30.3 pg (ref 26.0–34.0)
MCHC: 33.7 g/dL (ref 30.0–36.0)
MCV: 90.1 fL (ref 78.0–100.0)
Platelets: 208 K/uL (ref 150–400)
RBC: 3.43 MIL/uL — ABNORMAL LOW (ref 3.87–5.11)
RDW: 12.4 % (ref 11.5–15.5)
WBC: 13 K/uL — ABNORMAL HIGH (ref 4.0–10.5)

## 2013-10-26 LAB — BASIC METABOLIC PANEL
Anion gap: 9 (ref 5–15)
BUN: 10 mg/dL (ref 6–23)
CO2: 27 mEq/L (ref 19–32)
Calcium: 8.5 mg/dL (ref 8.4–10.5)
Chloride: 105 mEq/L (ref 96–112)
Creatinine, Ser: 0.61 mg/dL (ref 0.50–1.10)
GLUCOSE: 152 mg/dL — AB (ref 70–99)
POTASSIUM: 4.8 meq/L (ref 3.7–5.3)
SODIUM: 141 meq/L (ref 137–147)

## 2013-10-26 MED ORDER — NON FORMULARY: 40.0000 mg | Freq: Every day | Status: DC

## 2013-10-26 MED ORDER — OMEPRAZOLE 20 MG PO CPDR
40.0000 mg | DELAYED_RELEASE_CAPSULE | Freq: Every day | ORAL | Status: DC
  Administered 2013-10-26: 40 mg via ORAL
  Filled 2013-10-26 (×2): qty 2

## 2013-10-26 NOTE — Progress Notes (Signed)
   Subjective: 1 Day Post-Op Procedure(s) (LRB): LEFT TOTAL KNEE ARTHROPLASTY (Left) Patient reports pain as mild.   Patient seen in rounds with Dr. Wynelle Link. Family in room. Patient is well, but has had some minor complaints of pain in the knee, requiring pain medications Start therapy yesterday and walked 50 feet the day of surgery. Plan is to go Home after hospital stay.  Objective: Vital signs in last 24 hours: Temp:  [97.4 F (36.3 C)-98.3 F (36.8 C)] 98.2 F (36.8 C) (08/25 0455) Pulse Rate:  [63-80] 63 (08/25 0700) Resp:  [10-20] 16 (08/25 0455) BP: (103-137)/(52-91) 103/53 mmHg (08/25 0700) SpO2:  [96 %-100 %] 100 % (08/25 0455)  Intake/Output from previous day:  Intake/Output Summary (Last 24 hours) at 10/26/13 0900 Last data filed at 10/26/13 0546  Gross per 24 hour  Intake   2425 ml  Output   4145 ml  Net  -1720 ml    Intake/Output this shift: UOP 750 since MN  Labs:  Recent Labs  10/26/13 0410  HGB 10.4*    Recent Labs  10/26/13 0410  WBC 13.0*  RBC 3.43*  HCT 30.9*  PLT 208    Recent Labs  10/26/13 0410  NA 141  K 4.8  CL 105  CO2 27  BUN 10  CREATININE 0.61  GLUCOSE 152*  CALCIUM 8.5   No results found for this basename: LABPT, INR,  in the last 72 hours  EXAM General - Patient is Alert, Appropriate and Oriented Extremity - Neurovascular intact Sensation intact distally Dorsiflexion/Plantar flexion intact Dressing - dressing C/D/I Motor Function - intact, moving foot and toes well on exam.  Hemovac pulled without difficulty.  Past Medical History  Diagnosis Date  . Muscle pain   . Bilateral swelling of feet   . Thyroid disease   . Fatigue   . Hypothyroidism   . Anxiety   . Arthritis   . GERD (gastroesophageal reflux disease)   . Headache(784.0)   . Acute neck pain     bulging disk C4 through T1    Assessment/Plan: 1 Day Post-Op Procedure(s) (LRB): LEFT TOTAL KNEE ARTHROPLASTY (Left) Principal Problem:   OA  (osteoarthritis) of knee Active Problems:   Postsurgical hypothyroidism   Acute blood loss anemia  Estimated body mass index is 27.97 kg/(m^2) as calculated from the following:   Height as of this encounter: 5\' 4"  (1.626 m).   Weight as of this encounter: 73.936 kg (163 lb). Advance diet Up with therapy Plan for discharge tomorrow Discharge home with home health  DVT Prophylaxis - Xarelto Weight-Bearing as tolerated to left leg D/C O2 and Pulse OX and try on Room Air  Arlee Muslim, PA-C Orthopaedic Surgery 10/26/2013, 9:00 AM

## 2013-10-26 NOTE — Discharge Instructions (Addendum)
° °Dr. Frank Aluisio °Total Joint Specialist °[facility] °[address] °[place], [place] [address] °[phone] ° °TOTAL KNEE REPLACEMENT POSTOPERATIVE DIRECTIONS ° ° ° °Knee Rehabilitation, Guidelines Following Surgery  °Results after knee surgery are often greatly improved when you follow the exercise, range of motion and muscle strengthening exercises prescribed by your doctor. Safety measures are also important to protect the knee from further injury. Any time any of these exercises cause you to have increased pain or swelling in your knee joint, decrease the amount until you are comfortable again and slowly increase them. If you have problems or questions, call your caregiver or physical therapist for advice.  ° °HOME CARE INSTRUCTIONS  °Remove items at home which could result in a fall. This includes throw rugs or furniture in walking pathways.  °Continue medications as instructed at time of discharge. °You may have some home medications which will be placed on hold until you complete the course of blood thinner medication.  °You may start showering once you are discharged home but do not submerge the incision under water. Just pat the incision dry and apply a dry gauze dressing on daily. °Walk with [place] as instructed.  °You may resume a sexual relationship in one month or when given the OK by  your doctor.  °· Use Sarin as long as suggested by your caregivers. °· Avoid periods of inactivity such as sitting longer than an hour when not asleep. This helps prevent blood clots.  °You may put full weight on your legs and walk as much as is comfortable.  °You may return to work once you are cleared by your doctor.  °Do not drive a car for 6 weeks or until released by you surgeon.  °· Do not drive while taking narcotics.  °Wear the elastic stockings for three weeks following surgery during the day but you may remove then at night. °Make sure you keep all of your appointments after your  operation with all of your doctors and caregivers. You should call the office at the above phone number and make an appointment for approximately two weeks after the date of your surgery. °Change the dressing daily and reapply a dry dressing each time. °Please pick up a stool softener and laxative for home use as long as you are requiring pain medications. °· Continue to use ice on the knee for pain and swelling from surgery. You may notice swelling that will progress down to the foot and ankle.  This is normal after surgery.  Elevate the leg when you are not up walking on it.   °It is important for you to complete the blood thinner medication as prescribed by your doctor. °· Continue to use the breathing machine which will help keep your temperature down.  It is common for your temperature to cycle up and down following surgery, especially at night when you are not up moving around and exerting yourself.  The breathing machine keeps your lungs expanded and your temperature down. ° °RANGE OF MOTION AND STRENGTHENING EXERCISES  °Rehabilitation of the knee is important following a knee injury or an operation. After just a few days of immobilization, the muscles of the thigh which control the knee become weakened and shrink (atrophy). Knee exercises are designed to build up the tone and strength of the thigh muscles and to improve knee motion. Often times heat used for twenty to thirty minutes before working out will loosen up your tissues and help with improving the   range of motion but do not use heat for the first two weeks following surgery. These exercises can be done on a training (exercise) mat, on the floor, on a table or on a bed. Use what ever works the best and is most comfortable for you Knee exercises include:  °Leg Lifts - While your knee is still immobilized in a splint or cast, you can do straight leg raises. Lift the leg to 60 degrees, hold for 3 sec, and slowly lower the leg. Repeat 10-20 times 2-3  times daily. Perform this exercise against resistance later as your knee gets better.  °Quad and Hamstring Sets - Tighten up the muscle on the front of the thigh (Quad) and hold for 5-10 sec. Repeat this 10-20 times hourly. Hamstring sets are done by pushing the foot backward against an object and holding for 5-10 sec. Repeat as with quad sets.  °A rehabilitation program following serious knee injuries can speed recovery and prevent re-injury in the future due to weakened muscles. Contact your doctor or a physical therapist for more information on knee rehabilitation.  ° °SKILLED REHAB INSTRUCTIONS: °If the patient is transferred to a skilled rehab facility following release from the hospital, a list of the current medications will be sent to the facility for the patient to continue.  When discharged from the skilled rehab facility, please have the facility set up the patient's Home Health Physical Therapy prior to being released. Also, the skilled facility will be responsible for providing the patient with their medications at time of release from the facility to include their pain medication, the muscle relaxants, and their blood thinner medication. If the patient is still at the rehab facility at time of the two week follow up appointment, the skilled rehab facility will also need to assist the patient in arranging follow up appointment in our office and any transportation needs. ° °MAKE SURE YOU:  °Understand these instructions.  °Will watch your condition.  °Will get help right away if you are not doing well or get worse.  ° ° °Pick up stool softner and laxative for home. °Do not submerge incision under water. °May shower. °Continue to use ice for pain and swelling from surgery. ° °Take Xarelto for two and a half more weeks, then discontinue Xarelto. °Once the patient has completed the Xarelto, they may resume the 81 mg Aspirin. ° ° °Information on my medicine - XARELTO® (Rivaroxaban) ° °This medication  education was reviewed with me or my healthcare representative as part of my discharge preparation.  The pharmacist that spoke with me during my hospital stay was:  Absher, Randall K, RPH ° °Why was Xarelto® prescribed for you? °Xarelto® was prescribed for you to reduce the risk of blood clots forming after orthopedic surgery. The medical term for these abnormal blood clots is venous thromboembolism (VTE). ° °What do you need to know about xarelto® ? °Take your Xarelto® ONCE DAILY at the same time every day. °You may take it either with or without food. ° °If you have difficulty swallowing the tablet whole, you may crush it and mix in applesauce just prior to taking your dose. ° °Take Xarelto® exactly as prescribed by your doctor and DO NOT stop taking Xarelto® without talking to the doctor who prescribed the medication.  Stopping without other VTE prevention medication to take the place of Xarelto® may increase your risk of developing a clot. ° °After discharge, you should have regular check-up appointments with your healthcare provider that is   prescribing your Xarelto®.   ° °What do you do if you miss a dose? °If you miss a dose, take it as soon as you remember on the same day then continue your regularly scheduled once daily regimen the next day. Do not take two doses of Xarelto® on the same day.  ° °Important Safety Information °A possible side effect of Xarelto® is bleeding. You should call your healthcare provider right away if you experience any of the following: °  Bleeding from an injury or your nose that does not stop. °  Unusual colored urine (red or dark brown) or unusual colored stools (red or black). °  Unusual bruising for unknown reasons. °  A serious fall or if you hit your head (even if there is no bleeding). ° °Some medicines may interact with Xarelto® and might increase your risk of bleeding while on Xarelto®. To help avoid this, consult your healthcare provider or pharmacist prior to using any  new prescription or non-prescription medications, including herbals, vitamins, non-steroidal anti-inflammatory drugs (NSAIDs) and supplements. ° °This website has more information on Xarelto®: www.xarelto.com. ° ° °

## 2013-10-26 NOTE — Progress Notes (Signed)
Physical Therapy Treatment Patient Details Name: TIMIKO OFFUTT MRN: [id number] DOB: 1955/09/15 Today's Date: 10/26/2013    History of Present Illness LTKA    PT Comments    Pt reports cramping and difficulty with l knee extension. Instructed in elevating foot, knee presses  Follow Up Recommendations  Home health PT;Supervision/Assistance - 24 hour     Equipment Recommendations  None recommended by PT    Recommendations for Other Services       Precautions / Restrictions Precautions Precautions: Knee Required Braces or Orthoses: Knee Immobilizer - Left Restrictions Weight Bearing Restrictions: No    Mobility  Bed Mobility Transfers Overall transfer level: Needs assistance Equipment used: Rolling [place] (2 wheeled) Transfers: Sit to/from Stand Sit to Stand: Min assist;From elevated surface         General transfer comment: cues for hand and LLE position/safety  Ambulation/Gait Ambulation/Gait assistance: Min assist Ambulation Distance (Feet): 125 Feet Assistive device: Rolling [place] (2 wheeled) Gait Pattern/deviations: Step-through pattern;Antalgic     General Gait Details: cues for sequence and posture   Stairs            Wheelchair Mobility    Modified Rankin (Stroke Patients Only)       Balance Overall balance assessment: No apparent balance deficits (not formally assessed)                                  Cognition Arousal/Alertness: Awake/alert Behavior During Therapy: WFL for tasks assessed/performed Overall Cognitive Status: Within Functional Limits for tasks assessed                      Exercises Total Joint Exercises Ankle Circles/Pumps: AROM;Both;10 reps;Supine Quad Sets: AROM;Both;10 reps Heel Slides: AAROM;Left;10 reps;Supine Straight Leg Raises: AAROM;Left;10 reps;Supine Goniometric ROM: 20-50    General Comments        Pertinent Vitals/Pain Pain Assessment: 0-10 Pain Score: 8  Pain Location:  posterior knee Pain Descriptors / Indicators: Aching;Cramping Pain Intervention(s): Patient requesting pain meds-RN notified;Ice applied;Monitored during session    [place] expects to be discharged to:: Private residence Living Arrangements: Spouse/significant other Available Help at Discharge: Family Type of Home: House Home Access: Stairs to enter Entrance Stairs-Rails: None Home Layout: One level Home Equipment: Environmental consultant - 2 wheels;Bedside commode;Cane - single point      Prior Function Level of Independence: Independent          PT Goals (current goals can now be found in the care plan section) Acute Rehab PT Goals Patient Stated Goal: get better and have other knee donw Progress towards PT goals: Progressing toward goals    Frequency  7X/week    PT Plan Current plan remains appropriate    Co-evaluation             End of Session Equipment Utilized During Treatment: Left knee immobilizer (removed during ambulation) Activity Tolerance: Patient tolerated treatment well Patient left: in bed;with call bell/phone within reach;with family/visitor present     Time: 1057-1130 PT Time Calculation (min): 33 min  Charges:  $Gait Training: 8-22 mins $Therapeutic Exercise: 8-22 mins                    G Codes:      Claretha Cooper 10/26/2013, 12:56 PM

## 2013-10-26 NOTE — Evaluation (Signed)
Occupational Therapy Evaluation Patient Details Name: Michele Glenn MRN: [id number] DOB: 01/30/1956 Today's Date: 10/26/2013    History of Present Illness LTKA   Clinical Impression   Pt demonstrates decline in function and safety with ADLs and ADL mobility. Pt would benefit from acute OT services to address impairments to increase level of function and safety to return home    Follow Up Recommendations  No OT follow up;Supervision/Assistance - 24 hour    Equipment Recommendations  Other (comment) (ADL A/E kit)    Recommendations for Other Services       Precautions / Restrictions Precautions Precautions: Knee Required Braces or Orthoses: Knee Immobilizer - Left Restrictions Weight Bearing Restrictions: No      Mobility Bed Mobility Overal bed mobility: Needs Assistance Bed Mobility: Supine to Sit     Supine to sit: Min guard     General bed mobility comments: cues for technique  Transfers Overall transfer level: Needs assistance Equipment used: Rolling [place] (2 wheeled) Transfers: Sit to/from Stand Sit to Stand: Min assist;From elevated surface         General transfer comment: cues for hand and LLE position/safety    Balance Overall balance assessment: No apparent balance deficits (not formally assessed)                                          ADL Overall ADL's : Needs assistance/impaired     Grooming: Wash/dry hands;Wash/dry face;Standing;Min guard;Set up   Upper Body Bathing: Supervision/ safety;Set up;Sitting   Lower Body Bathing: Moderate assistance   Upper Body Dressing : Set up;Supervision/safety;Sitting   Lower Body Dressing: Moderate assistance   Toilet Transfer: Minimal assistance;RW;Cueing for safety (3 in 1 over toilet) Toilet Transfer Details (indicate cue type and reason): cues for hand placement Toileting- Clothing Manipulation and Hygiene: Minimal assistance;Sit to/from stand   Tub/ Shower Transfer: Minimal  assistance;3 in 1   Functional mobility during ADLs: Minimal assistance;Cueing for safety General ADL Comments: pt and her daughter provided with education and demonstration of ADL A/E for home use     Vision  wears glasses at all times                   Perception Perception Perception Tested?: No   Praxis Praxis Praxis tested?: Not tested    Pertinent Vitals/Pain Pain Assessment: 0-10 Pain Score: 4  Pain Descriptors / Indicators: Aching;Sore Pain Intervention(s): Premedicated before session;Monitored during session     Hand Dominance Right   Extremity/Trunk Assessment Upper Extremity Assessment Upper Extremity Assessment: Overall WFL for tasks assessed   Lower Extremity Assessment Lower Extremity Assessment: Defer to PT evaluation   Cervical / Trunk Assessment Cervical / Trunk Assessment: Normal   Communication Communication Communication: No difficulties   Cognition Arousal/Alertness: Awake/alert Behavior During Therapy: WFL for tasks assessed/performed Overall Cognitive Status: Within Functional Limits for tasks assessed                     General Comments   Pt very pleasant and cooperative                 Home Living Family/patient expects to be discharged to:: Private residence Living Arrangements: Spouse/significant other Available Help at Discharge: Family Type of Home: House Home Access: Stairs to enter Secretary/administrator of Steps: 2 Entrance Stairs-Rails: None Home Layout: One level     Bathroom  Shower/Tub: Tub/shower unit;Walk-in shower   Bathroom Toilet: Standard     Home Equipment: Environmental consultant - 2 wheels;Bedside commode;Cane - single point          Prior Functioning/Environment Level of Independence: Independent             OT Diagnosis: Acute pain   OT Problem List: Decreased knowledge of use of DME or AE;Pain;Decreased activity tolerance   OT Treatment/Interventions: Self-care/ADL training;Therapeutic  exercise;Patient/family education;Therapeutic activities;DME and/or AE instruction    OT Goals(Current goals can be found in the care plan section) Acute Rehab OT Goals Patient Stated Goal: get better and have other knee donw OT Goal Formulation: With patient/family Time For Goal Achievement: 11/02/13 Potential to Achieve Goals: Good ADL Goals Pt Will Perform Lower Body Bathing: with min assist;sit to/from stand;with caregiver independent in assisting;with adaptive equipment Pt Will Perform Lower Body Dressing: with min assist;with caregiver independent in assisting;with adaptive equipment;sit to/from stand Pt Will Transfer to Toilet: with min guard assist;with supervision;ambulating (3 in 1 over toilet) Pt Will Perform Toileting - Clothing Manipulation and hygiene: with min guard assist;with supervision;sit to/from stand Pt Will Perform Tub/Shower Transfer: with min guard assist;shower seat  OT Frequency: Min 2X/week   Barriers to D/C:    none                     End of Session Equipment Utilized During Treatment: Gait belt;Rolling [place];Other (comment) (3 in 1 over toilet) CPM Left Knee CPM Left Knee: Off  Activity Tolerance: Patient tolerated treatment well Patient left: in chair;with call bell/phone within reach;with family/visitor present   Time: 7673-4193 OT Time Calculation (min): 42 min Charges:  OT General Charges $OT Visit: 1 Procedure OT Evaluation $Initial OT Evaluation Tier I: 1 Procedure OT Treatments $Self Care/Home Management : 8-22 mins $Therapeutic Activity: 8-22 mins G-Codes:    Britt Bottom 10/26/2013, 10:52 AM

## 2013-10-27 LAB — CBC
HEMATOCRIT: 31.1 % — AB (ref 36.0–46.0)
HEMOGLOBIN: 10.8 g/dL — AB (ref 12.0–15.0)
MCH: 31.4 pg (ref 26.0–34.0)
MCHC: 34.7 g/dL (ref 30.0–36.0)
MCV: 90.4 fL (ref 78.0–100.0)
Platelets: 235 10*3/uL (ref 150–400)
RBC: 3.44 MIL/uL — AB (ref 3.87–5.11)
RDW: 12.5 % (ref 11.5–15.5)
WBC: 15.5 10*3/uL — AB (ref 4.0–10.5)

## 2013-10-27 LAB — BASIC METABOLIC PANEL
Anion gap: 12 (ref 5–15)
BUN: 13 mg/dL (ref 6–23)
CHLORIDE: 101 meq/L (ref 96–112)
CO2: 27 mEq/L (ref 19–32)
Calcium: 8.9 mg/dL (ref 8.4–10.5)
Creatinine, Ser: 0.66 mg/dL (ref 0.50–1.10)
GFR calc Af Amer: >90 mL/min (ref >90)
GFR calc non Af Amer: >90 mL/min (ref >90)
GLUCOSE: 123 mg/dL — AB (ref 70–99)
POTASSIUM: 5 meq/L (ref 3.7–5.3)
SODIUM: 140 meq/L (ref 137–147)

## 2013-10-27 MED ORDER — RIVAROXABAN 10 MG PO TABS: 10.0000 mg | ORAL_TABLET | Freq: Every day | ORAL | Status: DC

## 2013-10-27 MED ORDER — TRAMADOL HCL 50 MG PO TABS: 50.0000 mg | ORAL_TABLET | Freq: Four times a day (QID) | ORAL | Status: DC | PRN

## 2013-10-27 MED ORDER — METHOCARBAMOL 500 MG PO TABS: 500.0000 mg | ORAL_TABLET | Freq: Four times a day (QID) | ORAL | Status: DC | PRN

## 2013-10-27 MED ORDER — HYDROCODONE-ACETAMINOPHEN 5-325 MG PO TABS: 1.0000 | ORAL_TABLET | ORAL | Status: DC | PRN

## 2013-10-27 MED ORDER — HYDROCODONE-ACETAMINOPHEN 5-325 MG PO TABS
1.0000 | ORAL_TABLET | ORAL | Status: DC | PRN
  Administered 2013-10-27: 1 via ORAL
  Administered 2013-10-27: 2 via ORAL
  Filled 2013-10-27: qty 1
  Filled 2013-10-27: qty 2

## 2013-10-27 NOTE — Progress Notes (Signed)
   Subjective: 2 Days Post-Op Procedure(s) (LRB): LEFT TOTAL KNEE ARTHROPLASTY (Left) Patient reports pain as mild.   Plan is to go Home after hospital stay.  Objective: Vital signs in last 24 hours: Temp:  [98.1 F (36.7 C)-99.2 F (37.3 C)] 99.2 F (37.3 C) (08/26 0432) Pulse Rate:  [62-89] 69 (08/26 0432) Resp:  [16-18] 18 (08/26 0432) BP: (108-147)/(58-71) 147/71 mmHg (08/26 0432) SpO2:  [93 %-100 %] 99 % (08/26 0432)  Intake/Output from previous day:  Intake/Output Summary (Last 24 hours) at 10/27/13 0722 Last data filed at 10/27/13 0420  Gross per 24 hour  Intake 1037.5 ml  Output   2900 ml  Net -1862.5 ml    Intake/Output this shift:    Labs:  Recent Labs  10/26/13 0410 10/27/13 0415  HGB 10.4* 10.8*    Recent Labs  10/26/13 0410 10/27/13 0415  WBC 13.0* 15.5*  RBC 3.43* 3.44*  HCT 30.9* 31.1*  PLT 208 235    Recent Labs  10/26/13 0410 10/27/13 0415  NA 141 140  K 4.8 5.0  CL 105 101  CO2 27 27  BUN 10 13  CREATININE 0.61 0.66  GLUCOSE 152* 123*  CALCIUM 8.5 8.9   No results found for this basename: LABPT, INR,  in the last 72 hours  EXAM General - Patient is Alert, Appropriate and Oriented Extremity - Neurologically intact Neurovascular intact Incision: dressing C/D/I No cellulitis present Compartment soft Dressing/Incision - clean, dry, no drainage Motor Function - intact, moving foot and toes well on exam.   Past Medical History  Diagnosis Date  . Muscle pain   . Bilateral swelling of feet   . Thyroid disease   . Fatigue   . Hypothyroidism   . Anxiety   . Arthritis   . GERD (gastroesophageal reflux disease)   . Headache(784.0)   . Acute neck pain     bulging disk C4 through T1    Assessment/Plan: 2 Days Post-Op Procedure(s) (LRB): LEFT TOTAL KNEE ARTHROPLASTY (Left) Principal Problem:   OA (osteoarthritis) of knee Active Problems:   Postsurgical hypothyroidism   Acute blood loss anemia   Up with  therapy  DVT Prophylaxis - Xarelto Weight-Bearing as tolerated to left leg  Smith Potenza V 10/27/2013, 7:22 AM

## 2013-10-27 NOTE — Progress Notes (Signed)
Occupational Therapy Treatment Patient Details Name: Michele Glenn MRN: [id number] DOB: 01/31/1956 Today's Date: 10/27/2013    History of present illness LTKA   OT comments  Pt doing great!  Follow Up Recommendations  No OT follow up;Supervision/Assistance - 24 hour    Equipment Recommendations  Other (comment) (ADL A/E kit)       Precautions / Restrictions Precautions Precautions: Knee Restrictions Weight Bearing Restrictions: No       Mobility Bed Mobility Overal bed mobility: Needs Assistance Bed Mobility: Supine to Sit     Supine to sit: Supervision     General bed mobility comments: cues for technique  Transfers Overall transfer level: Needs assistance Equipment used: Rolling [place] (2 wheeled) Transfers: Sit to/from Stand Sit to Stand: Supervision                  ADL                       Lower Body Dressing: Min guard;Sit to/from stand   Toilet Transfer: RW;Cueing for safety;Supervision/safety   Toileting- Water quality scientist and Hygiene: Supervision/safety;Sit to/from stand   Tub/ Banker: Supervision/safety   Functional mobility during ADLs: Supervision/safety                  Cognition   Behavior During Therapy: [facility] for tasks assessed/performed Overall Cognitive Status: Within Functional Limits for tasks assessed                               General Comments  pt will have good A at home    Pertinent Vitals/ Pain       Pain Score: 4  Pain Location: posterior knee Left Pain Intervention(s): Repositioned;Ice applied         Frequency Min 2X/week     Progress Toward Goals  OT Goals(current goals can now be found in the care plan section)  Progress towards OT goals: Progressing toward goals     Plan         End of Session Equipment Utilized During Treatment: Gait belt;Rolling [place];Other (comment) (3 in 1 over toilet)   Activity Tolerance Patient tolerated treatment well   Patient  Left in chair;with call bell/phone within reach;with family/visitor present           Time: 1137-1150 OT Time Calculation (min): 13 min  Charges: OT General Charges $OT Visit: 1 Procedure OT Treatments $Self Care/Home Management : 8-22 mins  Michele Glenn D 10/27/2013, 11:52 AM

## 2013-10-27 NOTE — Discharge Summary (Signed)
Physician Discharge Summary   Patient ID: Michele Glenn MRN: [id number] DOB/AGE: December 03, 1955 58 y.o.  Admit date: 10/25/2013 Discharge date: 10/27/2013  Primary Diagnosis:  Osteoarthritis Left knee(s)  Admission Diagnoses:  Past Medical History  Diagnosis Date  . Muscle pain   . Bilateral swelling of feet   . Thyroid disease   . Fatigue   . Hypothyroidism   . Anxiety   . Arthritis   . GERD (gastroesophageal reflux disease)   . Headache(784.0)   . Acute neck pain     bulging disk C4 through T1   Discharge Diagnoses:   Principal Problem:   OA (osteoarthritis) of knee Active Problems:   Postsurgical hypothyroidism   Acute blood loss anemia  Estimated body mass index is 27.97 kg/(m^2) as calculated from the following:   Height as of this encounter: 5' 4"  (1.626 m).   Weight as of this encounter: 73.936 kg (163 lb).  Procedure:  Procedure(s) (LRB): LEFT TOTAL KNEE ARTHROPLASTY (Left)   Consults: None  HPI: Michele Glenn is a 58 y.o. year old female with end stage OA of her left knee with progressively worsening pain and dysfunction. She has constant pain, with activity and at rest and significant functional deficits with difficulties even with ADLs. She has had extensive non-op management including analgesics, injections of cortisone and viscosupplements, and home exercise program, but remains in significant pain with significant dysfunction. Radiographs show bone on bone arthritis patellofemoral with significant lateral narrowing. She presents now for left Total Knee Arthroplasty.   Laboratory Data: Admission on 10/25/2013, Discharged on 10/27/2013  Component Date Value Ref Range Status  . ABO/RH(D) 10/25/2013 A NEG   Final  . Antibody Screen 10/25/2013 NEG   Final  . Sample Expiration 10/25/2013 10/28/2013   Final  . ABO/RH(D) 10/25/2013 A NEG   Final  . WBC 10/26/2013 13.0* 4.0 - 10.5 K/uL Final  . RBC 10/26/2013 3.43* 3.87 - 5.11 MIL/uL Final  . Hemoglobin 10/26/2013  10.4* 12.0 - 15.0 g/dL Final  . HCT 10/26/2013 30.9* 36.0 - 46.0 % Final  . MCV 10/26/2013 90.1  78.0 - 100.0 fL Final  . MCH 10/26/2013 30.3  26.0 - 34.0 pg Final  . MCHC 10/26/2013 33.7  30.0 - 36.0 g/dL Final  . RDW 10/26/2013 12.4  11.5 - 15.5 % Final  . Platelets 10/26/2013 208  150 - 400 K/uL Final  . Sodium 10/26/2013 141  137 - 147 mEq/L Final  . Potassium 10/26/2013 4.8  3.7 - 5.3 mEq/L Final  . Chloride 10/26/2013 105  96 - 112 mEq/L Final  . CO2 10/26/2013 27  19 - 32 mEq/L Final  . Glucose, Bld 10/26/2013 152* 70 - 99 mg/dL Final  . BUN 10/26/2013 10  6 - 23 mg/dL Final  . Creatinine, Ser 10/26/2013 0.61  0.50 - 1.10 mg/dL Final  . Calcium 10/26/2013 8.5  8.4 - 10.5 mg/dL Final  . GFR calc non Af Amer 10/26/2013 >90  >90 mL/min Final  . GFR calc Af Amer 10/26/2013 >90  >90 mL/min Final   Comment: (NOTE)                          The eGFR has been calculated using the CKD EPI equation.                          This calculation has not been validated in all clinical situations.  eGFR's persistently <90 mL/min signify possible Chronic Kidney                          Disease.  . Anion gap 10/26/2013 9  5 - 15 Final  . WBC 10/27/2013 15.5* 4.0 - 10.5 K/uL Final  . RBC 10/27/2013 3.44* 3.87 - 5.11 MIL/uL Final  . Hemoglobin 10/27/2013 10.8* 12.0 - 15.0 g/dL Final  . HCT 10/27/2013 31.1* 36.0 - 46.0 % Final  . MCV 10/27/2013 90.4  78.0 - 100.0 fL Final  . MCH 10/27/2013 31.4  26.0 - 34.0 pg Final  . MCHC 10/27/2013 34.7  30.0 - 36.0 g/dL Final  . RDW 10/27/2013 12.5  11.5 - 15.5 % Final  . Platelets 10/27/2013 235  150 - 400 K/uL Final  . Sodium 10/27/2013 140  137 - 147 mEq/L Final  . Potassium 10/27/2013 5.0  3.7 - 5.3 mEq/L Final  . Chloride 10/27/2013 101  96 - 112 mEq/L Final  . CO2 10/27/2013 27  19 - 32 mEq/L Final  . Glucose, Bld 10/27/2013 123* 70 - 99 mg/dL Final  . BUN 10/27/2013 13  6 - 23 mg/dL Final  . Creatinine, Ser 10/27/2013 0.66   0.50 - 1.10 mg/dL Final  . Calcium 10/27/2013 8.9  8.4 - 10.5 mg/dL Final  . GFR calc non Af Amer 10/27/2013 >90  >90 mL/min Final  . GFR calc Af Amer 10/27/2013 >90  >90 mL/min Final   Comment: (NOTE)                          The eGFR has been calculated using the CKD EPI equation.                          This calculation has not been validated in all clinical situations.                          eGFR's persistently <90 mL/min signify possible Chronic Kidney                          Disease.  Georgiann Hahn gap 10/27/2013 12  5 - 15 Final  Hospital Outpatient Visit on 10/18/2013  Component Date Value Ref Range Status  . MRSA, PCR 10/18/2013 NEGATIVE  NEGATIVE Final  . Staphylococcus aureus 10/18/2013 NEGATIVE  NEGATIVE Final   Comment:                                 The Xpert SA Assay (FDA                          approved for NASAL specimens                          in patients over 49 years of age),                          is one component of                          a comprehensive surveillance  program.  Test performance has                          been validated by [facility] for patients greater                          than or equal to 41 year old.                          It is not intended                          to diagnose infection nor to                          guide or monitor treatment.  [name] Kitchen aPTT 10/18/2013 33  24 - 37 seconds Final  . WBC 10/18/2013 5.4  4.0 - 10.5 K/uL Final  . RBC 10/18/2013 4.61  3.87 - 5.11 MIL/uL Final  . Hemoglobin 10/18/2013 14.1  12.0 - 15.0 g/dL Final  . HCT 10/18/2013 41.2  36.0 - 46.0 % Final  . MCV 10/18/2013 89.4  78.0 - 100.0 fL Final  . MCH 10/18/2013 30.6  26.0 - 34.0 pg Final  . MCHC 10/18/2013 34.2  30.0 - 36.0 g/dL Final  . RDW 10/18/2013 12.1  11.5 - 15.5 % Final  . Platelets 10/18/2013 221  150 - 400 K/uL Final  . Sodium 10/18/2013 138  137 - 147 mEq/L Final  . Potassium  10/18/2013 4.7  3.7 - 5.3 mEq/L Final  . Chloride 10/18/2013 99  96 - 112 mEq/L Final  . CO2 10/18/2013 28  19 - 32 mEq/L Final  . Glucose, Bld 10/18/2013 98  70 - 99 mg/dL Final  . BUN 10/18/2013 12  6 - 23 mg/dL Final  . Creatinine, Ser 10/18/2013 0.61  0.50 - 1.10 mg/dL Final  . Calcium 10/18/2013 9.9  8.4 - 10.5 mg/dL Final  . Total Protein 10/18/2013 7.7  6.0 - 8.3 g/dL Final  . Albumin 10/18/2013 4.1  3.5 - 5.2 g/dL Final  . AST 10/18/2013 26  0 - 37 U/L Final  . ALT 10/18/2013 26  0 - 35 U/L Final  . Alkaline Phosphatase 10/18/2013 103  39 - 117 U/L Final  . Total Bilirubin 10/18/2013 0.5  0.3 - 1.2 mg/dL Final  . GFR calc non Af Amer 10/18/2013 >90  >90 mL/min Final  . GFR calc Af Amer 10/18/2013 >90  >90 mL/min Final   Comment: (NOTE)                          The eGFR has been calculated using the CKD EPI equation.                          This calculation has not been validated in all clinical situations.                          eGFR's persistently <90 mL/min signify possible Chronic Kidney  Disease.  . Anion gap 10/18/2013 11  5 - 15 Final  . Prothrombin Time 10/18/2013 13.2  11.6 - 15.2 seconds Final  . INR 10/18/2013 1.00  0.00 - 1.49 Final  . Color, Urine 10/18/2013 YELLOW  YELLOW Final  . APPearance 10/18/2013 CLEAR  CLEAR Final  . Specific Gravity, Urine 10/18/2013 1.017  1.005 - 1.030 Final  . pH 10/18/2013 7.0  5.0 - 8.0 Final  . Glucose, UA 10/18/2013 NEGATIVE  NEGATIVE mg/dL Final  . Hgb urine dipstick 10/18/2013 NEGATIVE  NEGATIVE Final  . Bilirubin Urine 10/18/2013 NEGATIVE  NEGATIVE Final  . Ketones, ur 10/18/2013 NEGATIVE  NEGATIVE mg/dL Final  . Protein, ur 10/18/2013 NEGATIVE  NEGATIVE mg/dL Final  . Urobilinogen, UA 10/18/2013 0.2  0.0 - 1.0 mg/dL Final  . Nitrite 10/18/2013 NEGATIVE  NEGATIVE Final  . Leukocytes, UA 10/18/2013 NEGATIVE  NEGATIVE Final   MICROSCOPIC NOT DONE ON URINES WITH NEGATIVE PROTEIN, BLOOD, LEUKOCYTES,  NITRITE, OR GLUCOSE <1000 mg/dL.     X-Rays:No results found.  EKG:No orders found for this or any previous visit.   Hospital Course: Michele Glenn is a 59 y.o. who was admitted to [facility]. They were brought to the operating room on 10/25/2013 and underwent Procedure(s): LEFT TOTAL KNEE ARTHROPLASTY.  Patient tolerated the procedure well and was later transferred to the recovery room and then to the orthopaedic floor for postoperative care.  They were given PO and IV analgesics for pain control following their surgery.  They were given 24 hours of postoperative antibiotics of  Anti-infectives   Start     Dose/Rate Route Frequency Ordered Stop   10/25/13 1300  ceFAZolin (ANCEF) IVPB 2 g/50 mL premix     2 g 100 mL/hr over 30 Minutes Intravenous Every 6 hours 10/25/13 0957 10/25/13 1835   10/25/13 0504  ceFAZolin (ANCEF) IVPB 2 g/50 mL premix     2 g 100 mL/hr over 30 Minutes Intravenous On call to O.R. 10/25/13 1308 10/25/13 0708     and started on DVT prophylaxis in the form of Xarelto.   PT and OT were ordered for total joint protocol.  Discharge planning consulted to help with postop disposition and equipment needs.  Patient had a very good night on the evening of surgery getting up and walking 50 feet.  They started to get up OOB with therapy again on day one. Hemovac drain was pulled without difficulty.  Continued to work with therapy into day two.  Dressing was changed on day two and the incision was healing well. Patient was seen in rounds by Dr. Wynelle Link and was ready to go home.   Diet: Regular diet Activity:WBAT Follow-up:in 2 weeks Disposition - Home Discharged Condition: good       Discharge Instructions   Call MD / Call 911    Complete by:  As directed   If you experience chest pain or shortness of breath, CALL 911 and be transported to the hospital emergency room.  If you develope a fever above 101 F, pus (white drainage) or increased drainage or redness at the  wound, or calf pain, call your surgeon's office.     Change dressing    Complete by:  As directed   Change dressing daily with sterile 4 x 4 inch gauze dressing and apply TED hose. Do not submerge the incision under water.     Constipation Prevention    Complete by:  As directed   Drink plenty of fluids.  Prune juice may be helpful.  You may use a stool softener, such as Colace (over the counter) 100 mg twice a day.  Use MiraLax (over the counter) for constipation as needed.     Diet general    Complete by:  As directed      Discharge instructions    Complete by:  As directed   Pick up stool softner and laxative for home. Do not submerge incision under water. May shower. Continue to use ice for pain and swelling from surgery.  Take Xarelto for two and a half more weeks, then discontinue Xarelto. Once the patient has completed the Xarelto, they may resume the 81 mg Aspirin.     Do not put a pillow under the knee. Place it under the heel.    Complete by:  As directed      Do not sit on low chairs, stoools or toilet seats, as it may be difficult to get up from low surfaces    Complete by:  As directed      Driving restrictions    Complete by:  As directed   No driving until released by the physician.     Increase activity slowly as tolerated    Complete by:  As directed      Lifting restrictions    Complete by:  As directed   No lifting until released by the physician.     Patient may shower    Complete by:  As directed   You may shower without a dressing once there is no drainage.  Do not wash over the wound.  If drainage remains, do not shower until drainage stops.     TED hose    Complete by:  As directed   Use stockings (TED hose) for 3 weeks on both leg(s).  You may remove them at night for sleeping.     Weight bearing as tolerated    Complete by:  As directed             Medication List    STOP taking these medications       aspirin EC 81 MG tablet      ciprofloxacin 500 MG tablet  Commonly known as:  CIPRO     folic acid 1 MG tablet  Commonly known as:  FOLVITE     multivitamin with minerals Tabs tablet     polyvinyl alcohol 1.4 % ophthalmic solution  Commonly known as:  LIQUIFILM TEARS     PROBIOTIC DAILY PO     vitamin C 1000 MG tablet      TAKE these medications       acetaminophen 500 MG tablet  Commonly known as:  TYLENOL  Take 500 mg by mouth every 6 (six) hours as needed (Pain).     HYDROcodone-acetaminophen 5-325 MG per tablet  Commonly known as:  NORCO/VICODIN  Take 1-2 tablets by mouth every 4 (four) hours as needed for moderate pain.     levothyroxine 100 MCG tablet  Commonly known as:  SYNTHROID, LEVOTHROID  Take 100 mcg by mouth daily before breakfast.     methocarbamol 500 MG tablet  Commonly known as:  ROBAXIN  Take 1 tablet (500 mg total) by mouth every 6 (six) hours as needed for muscle spasms.     omeprazole 40 MG capsule  Commonly known as:  PRILOSEC  Take 40 mg by mouth at bedtime.     rivaroxaban 10 MG Tabs tablet  Commonly known as:  XARELTO  - Take 1 tablet (10 mg total) by mouth daily with breakfast. Take Xarelto for two and a half more weeks, then discontinue Xarelto.  - Once the patient has completed the Xarelto, they may resume the 81 mg Aspirin.     sertraline 50 MG tablet  Commonly known as:  ZOLOFT  Take 50 mg by mouth at bedtime.     traMADol 50 MG tablet  Commonly known as:  ULTRAM  Take 1-2 tablets (50-100 mg total) by mouth every 6 (six) hours as needed (mild pain).       Follow-up Information   Follow up with Gearlean Alf, MD. Schedule an appointment as soon as possible for a visit in 2 weeks.   Specialty:  Orthopedic Surgery   Contact information:   [address] [place] [address] [phone]       Signed: Arlee Muslim, PA-C Orthopaedic Surgery 11/04/2013, 9:08 AM

## 2013-10-27 NOTE — Progress Notes (Signed)
Discharge instructions reviewed with patient utilizing teach back method no questions at this time. Patient to be discharged home waiting on ride.

## 2013-10-27 NOTE — Progress Notes (Signed)
Physical Therapy Treatment Patient Details Name: Michele Glenn MRN: [id number] DOB: 04-28-1955 Today's Date: 10/27/2013    History of Present Illness LTKA    PT Comments    Pt tolerated well. Ready for DC.  Follow Up Recommendations  Home health PT;Supervision/Assistance - 24 hour     Equipment Recommendations       Recommendations for Other Services       Precautions / Restrictions Precautions Precautions: Knee Required Braces or Orthoses: Knee Immobilizer - Left Restrictions Weight Bearing Restrictions: No    Mobility  Bed Mobility Overal bed mobility: Modified Independent Bed Mobility: Supine to Sit     Supine to sit: Supervision     General bed mobility comments: cues for technique  Transfers Overall transfer level: Needs assistance Equipment used: Rolling Ciszewski (2 wheeled) Transfers: Sit to/from Stand Sit to Stand: Supervision         General transfer comment: cues for hand and LLE position/safety  Ambulation/Gait Ambulation/Gait assistance: Supervision Ambulation Distance (Feet): 125 Feet Assistive device: Rolling Whisman (2 wheeled) Gait Pattern/deviations: Step-through pattern;Step-to pattern     General Gait Details: cues for sequence and posture   Stairs Stairs: Yes Stairs assistance: Min assist Stair Management: No rails;Backwards;With Berch Number of Stairs: 2 General stair comments: cues for sequence  Wheelchair Mobility    Modified Rankin (Stroke Patients Only)       Balance                                    Cognition Arousal/Alertness: Awake/alert Behavior During Therapy: WFL for tasks assessed/performed Overall Cognitive Status: Within Functional Limits for tasks assessed                      Exercises Total Joint Exercises Ankle Circles/Pumps: AROM;Both;10 reps;Supine Quad Sets: AROM;Both;10 reps Short Arc QuadSinclair Ship;Left;10 reps;Supine Heel Slides: AAROM;Left;10 reps;Supine Hip  ABduction/ADduction: AROM;Left;10 reps Straight Leg Raises: AAROM;Left;10 reps;Supine Goniometric ROM: 10-60 L knee    General Comments        Pertinent Vitals/Pain Pain Score: 4  Pain Location: posterior L knee Pain Descriptors / Indicators: Aching;Tightness Pain Intervention(s): Monitored during session;Premedicated before session;Ice applied    Home Living                      Prior Function            PT Goals (current goals can now be found in the care plan section) Progress towards PT goals: Progressing toward goals    Frequency       PT Plan Current plan remains appropriate    Co-evaluation             End of Session Equipment Utilized During Treatment: Left knee immobilizer (for steps only) Activity Tolerance: Patient tolerated treatment well Patient left: in bed;with call bell/phone within reach;with family/visitor present     Time: 0915-0950 PT Time Calculation (min): 35 min  Charges:  $Gait Training: 8-22 mins $Therapeutic Exercise: 8-22 mins                    G Codes:      Claretha Cooper 10/27/2013, 1:00 PM

## 2014-01-25 ENCOUNTER — Ambulatory Visit: Payer: Self-pay | Admitting: Orthopedic Surgery

## 2014-01-25 NOTE — Progress Notes (Signed)
Preoperative surgical orders have been place into the Epic hospital system for Michele Glenn on 01/25/2014, 1:52 PM  by Mickel Crow for surgery on 02-09-2014.  Preop Total Knee orders including Experal, IV Tylenol, and IV Decadron as long as there are no contraindications to the above medications. Arlee Muslim, PA-C

## 2014-02-01 ENCOUNTER — Other Ambulatory Visit (HOSPITAL_COMMUNITY): Payer: Self-pay | Admitting: *Deleted

## 2014-02-01 NOTE — Patient Instructions (Addendum)
Michele Glenn  02/01/2014   Your procedure is scheduled on:  Wednesday December 9th, 2015  Report to [place]  Entrance and follow signs to               [place] at 200 PM  Call this number if you have problems the morning of surgery [phone]   Remember:  Do not eat food  :After Midnight Tuesday night, clear liquids midnight 1000am day of surgery, no clear liquids after 1000 am day of surgery.     Take these medicines the morning of surgery with A SIP OF WATER: LEVOTHYROXINE                               You may not have any metal on your body including hair pins and              piercings  Do not wear jewelry, make-up, lotions, powders or perfumes.             Do not wear nail polish.  Do not shave  48 hours prior to surgery.              Men may shave face and neck.   Do not bring valuables to the hospital. [place].  Contacts, dentures or bridgework may not be worn into surgery.  Leave suitcase in the car. After surgery it may be brought to your room.     Patients discharged the day of surgery will not be allowed to drive home.  Name and phone number of your driver:  Special Instructions: N/A              Please read over the following fact sheets you were given: _____________________________________________________________________             [facility] - Preparing for Surgery Before surgery, you can play an important role.  Because skin is not sterile, your skin needs to be as free of germs as possible.  You can reduce the number of germs on your skin by washing with CHG (chlorahexidine gluconate) soap before surgery.  CHG is an antiseptic cleaner which kills germs and bonds with the skin to continue killing germs even after washing. Please DO NOT use if you have an allergy to CHG or antibacterial soaps.  If your skin becomes reddened/irritated stop using the CHG and  inform your nurse when you arrive at Short Stay. Do not shave (including legs and underarms) for at least 48 hours prior to the first CHG shower.  You may shave your face/neck. Please follow these instructions carefully:  1.  Shower with CHG Soap the night before surgery and the  morning of Surgery.  2.  If you choose to wash your hair, wash your hair first as usual with your  normal  shampoo.  3.  After you shampoo, rinse your hair and body thoroughly to remove the  shampoo.                           4.  Use CHG as you would any other liquid soap.  You can apply chg directly  to the skin and wash  Gently with a scrungie or clean washcloth.  5.  Apply the CHG Soap to your body ONLY FROM THE NECK DOWN.   Do not use on face/ open                           Wound or open sores. Avoid contact with eyes, ears mouth and genitals (private parts).                       Wash face,  Genitals (private parts) with your normal soap.             6.  Wash thoroughly, paying special attention to the area where your surgery  will be performed.  7.  Thoroughly rinse your body with warm water from the neck down.  8.  DO NOT shower/wash with your normal soap after using and rinsing off  the CHG Soap.                9.  Pat yourself dry with a clean towel.            10.  Wear clean pajamas.            11.  Place clean sheets on your bed the night of your first shower and do not  sleep with pets. Day of Surgery : Do not apply any lotions/deodorants the morning of surgery.  Please wear clean clothes to the hospital/surgery center.  FAILURE TO FOLLOW THESE INSTRUCTIONS MAY RESULT IN THE CANCELLATION OF YOUR SURGERY PATIENT SIGNATURE_________________________________  NURSE SIGNATURE__________________________________  ________________________________________________________________________    CLEAR LIQUID DIET   Foods Allowed                                                                      Foods Excluded  Coffee and tea, regular and decaf                             liquids that you cannot  Plain Jell-O in any flavor                                             see through such as: Fruit ices (not with fruit pulp)                                     milk, soups, orange juice  Iced Popsicles                                    All solid food Carbonated beverages, regular and diet                                    Cranberry, grape and apple juices Sports drinks like Gatorade Lightly seasoned clear broth or consume(fat free) Sugar, honey syrup  Sample Menu Breakfast                                Lunch                                     Supper Cranberry juice                    Beef broth                            Chicken broth Jell-O                                     Grape juice                           Apple juice Coffee or tea                        Jell-O                                      Popsicle                                                Coffee or tea                        Coffee or tea  _____________________________________________________________________    Incentive Spirometer  An incentive spirometer is a tool that can help keep your lungs clear and active. This tool measures how well you are filling your lungs with each breath. Taking long deep breaths may help reverse or decrease the chance of developing breathing (pulmonary) problems (especially infection) following:  A long period of time when you are unable to move or be active. BEFORE THE PROCEDURE   If the spirometer includes an indicator to show your best effort, your nurse or respiratory therapist will set it to a desired goal.  If possible, sit up straight or lean slightly forward. Try not to slouch.  Hold the incentive spirometer in an upright position. INSTRUCTIONS FOR USE   Sit on the edge of your bed if possible, or sit up as far as you can in bed or on a chair.  Hold the  incentive spirometer in an upright position.  Breathe out normally.  Place the mouthpiece in your mouth and seal your lips tightly around it.  Breathe in slowly and as deeply as possible, raising the piston or the ball toward the top of the column.  Hold your breath for 3-5 seconds or for as long as possible. Allow the piston or ball to fall to the bottom of the column.  Remove the mouthpiece from your mouth and breathe out normally.  Rest for a few seconds and repeat Steps 1 through 7 at least 10 times every 1-2 hours when you are awake. Take your time and take a few normal breaths between  deep breaths.  The spirometer may include an indicator to show your best effort. Use the indicator as a goal to work toward during each repetition.  After each set of 10 deep breaths, practice coughing to be sure your lungs are clear. If you have an incision (the cut made at the time of surgery), support your incision when coughing by placing a pillow or rolled up towels firmly against it. Once you are able to get out of bed, walk around indoors and cough well. You may stop using the incentive spirometer when instructed by your caregiver.  RISKS AND COMPLICATIONS  Take your time so you do not get dizzy or light-headed.  If you are in pain, you may need to take or ask for pain medication before doing incentive spirometry. It is harder to take a deep breath if you are having pain. AFTER USE  Rest and breathe slowly and easily.  It can be helpful to keep track of a log of your progress. Your caregiver can provide you with a simple table to help with this. If you are using the spirometer at home, follow these instructions: [place] IF:   You are having difficultly using the spirometer.  You have trouble using the spirometer as often as instructed.  Your pain medication is not giving enough relief while using the spirometer.  You develop fever of 100.5 F (38.1 C) or higher. SEEK  IMMEDIATE MEDICAL CARE IF:   You cough up bloody sputum that had not been present before.  You develop fever of 102 F (38.9 C) or greater.  You develop worsening pain at or near the incision site. MAKE SURE YOU:   Understand these instructions.  Will watch your condition.  Will get help right away if you are not doing well or get worse. Document Released: 07/01/2006 Document Revised: 05/13/2011 Document Reviewed: 09/01/2006 ExitCare Patient Information 2014 ExitCare, [place].   ________________________________________________________________________  WHAT IS A BLOOD TRANSFUSION? Blood Transfusion Information  A transfusion is the replacement of blood or some of its parts. Blood is made up of multiple cells which provide different functions.  Red blood cells carry oxygen and are used for blood loss replacement.  White blood cells fight against infection.  Platelets control bleeding.  Plasma helps clot blood.  Other blood products are available for specialized needs, such as hemophilia or other clotting disorders. BEFORE THE TRANSFUSION  Who gives blood for transfusions?   Healthy volunteers who are fully evaluated to make sure their blood is safe. This is blood bank blood. Transfusion therapy is the safest it has ever been in the practice of medicine. Before blood is taken from a donor, a complete history is taken to make sure that person has no history of diseases nor engages in risky social behavior (examples are intravenous drug use or sexual activity with multiple partners). The donor's travel history is screened to minimize risk of transmitting infections, such as malaria. The donated blood is tested for signs of infectious diseases, such as HIV and hepatitis. The blood is then tested to be sure it is compatible with you in order to minimize the chance of a transfusion reaction. If you or a relative donates blood, this is often done in anticipation of surgery and is not  appropriate for emergency situations. It takes many days to process the donated blood. RISKS AND COMPLICATIONS Although transfusion therapy is very safe and saves many lives, the main dangers of transfusion include:   Getting an infectious disease.  Developing a transfusion reaction. This is an allergic reaction to something in the blood you were given. Every precaution is taken to prevent this. The decision to have a blood transfusion has been considered carefully by your caregiver before blood is given. Blood is not given unless the benefits outweigh the risks. AFTER THE TRANSFUSION  Right after receiving a blood transfusion, you will usually feel much better and more energetic. This is especially true if your red blood cells have gotten low (anemic). The transfusion raises the level of the red blood cells which carry oxygen, and this usually causes an energy increase.  The nurse administering the transfusion will monitor you carefully for complications. HOME CARE INSTRUCTIONS  No special instructions are needed after a transfusion. You may find your energy is better. Speak with your caregiver about any limitations on activity for underlying diseases you may have. SEEK MEDICAL CARE IF:   Your condition is not improving after your transfusion.  You develop redness or irritation at the intravenous (IV) site. SEEK IMMEDIATE MEDICAL CARE IF:  Any of the following symptoms occur over the next 12 hours:  Shaking chills.  You have a temperature by mouth above 102 F (38.9 C), not controlled by medicine.  Chest, back, or muscle pain.  People around you feel you are not acting correctly or are confused.  Shortness of breath or difficulty breathing.  Dizziness and fainting.  You get a rash or develop hives.  You have a decrease in urine output.  Your urine turns a dark color or changes to pink, red, or brown. Any of the following symptoms occur over the next 10 days:  You have a  temperature by mouth above 102 F (38.9 C), not controlled by medicine.  Shortness of breath.  Weakness after normal activity.  The white part of the eye turns yellow (jaundice).  You have a decrease in the amount of urine or are urinating less often.  Your urine turns a dark color or changes to pink, red, or brown. Document Released: 02/16/2000 Document Revised: 05/13/2011 Document Reviewed: 10/05/2007 [facility] Patient Information 2014 [place], [place].  _______________________________________________________________________

## 2014-02-02 ENCOUNTER — Encounter (HOSPITAL_COMMUNITY): Payer: Self-pay

## 2014-02-02 ENCOUNTER — Encounter (HOSPITAL_COMMUNITY)
Admission: RE | Admit: 2014-02-02 | Discharge: 2014-02-02 | Disposition: A | Discharge status: Home / Self Care | Payer: BC Managed Care – PPO | Source: Ambulatory Visit | Attending: Orthopedic Surgery | Admitting: Orthopedic Surgery

## 2014-02-02 DIAGNOSIS — Z01812 Encounter for preprocedural laboratory examination: Secondary | ICD-10-CM | POA: Diagnosis not present

## 2014-02-02 HISTORY — DX: Other specified postprocedural states: Z98.890

## 2014-02-02 HISTORY — DX: Other complications of anesthesia, initial encounter: T88.59XA

## 2014-02-02 HISTORY — DX: Cardiac murmur, unspecified: R01.1

## 2014-02-02 HISTORY — DX: Other specified postprocedural states: R11.2

## 2014-02-02 HISTORY — DX: Fibromyalgia: M79.7

## 2014-02-02 HISTORY — DX: Adverse effect of unspecified anesthetic, initial encounter: T41.45XA

## 2014-02-02 HISTORY — DX: Malignant (primary) neoplasm, unspecified: C80.1

## 2014-02-02 LAB — CBC
HEMATOCRIT: 42.1 % (ref 36.0–46.0)
HEMOGLOBIN: 13.9 g/dL (ref 12.0–15.0)
MCH: 29.3 pg (ref 26.0–34.0)
MCHC: 33 g/dL (ref 30.0–36.0)
MCV: 88.6 fL (ref 78.0–100.0)
Platelets: 264 10*3/uL (ref 150–400)
RBC: 4.75 MIL/uL (ref 3.87–5.11)
RDW: 12.7 % (ref 11.5–15.5)
WBC: 7.7 10*3/uL (ref 4.0–10.5)

## 2014-02-02 LAB — APTT: aPTT: 31 seconds (ref 24–37)

## 2014-02-02 LAB — PROTIME-INR
INR: 1 (ref 0.00–1.49)
PROTHROMBIN TIME: 13.3 s (ref 11.6–15.2)

## 2014-02-02 LAB — URINALYSIS, ROUTINE W REFLEX MICROSCOPIC
BILIRUBIN URINE: NEGATIVE
Glucose, UA: NEGATIVE mg/dL
HGB URINE DIPSTICK: NEGATIVE
Ketones, ur: NEGATIVE mg/dL
Leukocytes, UA: NEGATIVE
Nitrite: NEGATIVE
Protein, ur: NEGATIVE mg/dL
SPECIFIC GRAVITY, URINE: 1.021 (ref 1.005–1.030)
Urobilinogen, UA: 0.2 mg/dL (ref 0.0–1.0)
pH: 5.5 (ref 5.0–8.0)

## 2014-02-02 LAB — COMPREHENSIVE METABOLIC PANEL
ALT: 17 U/L (ref 0–35)
AST: 20 U/L (ref 0–37)
Albumin: 3.9 g/dL (ref 3.5–5.2)
Alkaline Phosphatase: 106 U/L (ref 39–117)
Anion gap: 14 (ref 5–15)
BILIRUBIN TOTAL: 0.4 mg/dL (ref 0.3–1.2)
BUN: 16 mg/dL (ref 6–23)
CO2: 24 meq/L (ref 19–32)
CREATININE: 0.61 mg/dL (ref 0.50–1.10)
Calcium: 9.5 mg/dL (ref 8.4–10.5)
Chloride: 99 mEq/L (ref 96–112)
GFR calc Af Amer: >90 mL/min (ref >90)
GLUCOSE: 94 mg/dL (ref 70–99)
Potassium: 4.4 mEq/L (ref 3.7–5.3)
Sodium: 137 mEq/L (ref 137–147)
Total Protein: 7.7 g/dL (ref 6.0–8.3)

## 2014-02-02 LAB — SURGICAL PCR SCREEN
MRSA, PCR: NEGATIVE
STAPHYLOCOCCUS AUREUS: NEGATIVE

## 2014-02-02 NOTE — Progress Notes (Signed)
Stress test 12-28-2012 dr Geraldo Pitter on chart

## 2014-02-08 ENCOUNTER — Ambulatory Visit: Payer: Self-pay | Admitting: Orthopedic Surgery

## 2014-02-08 NOTE — H&P (Signed)
Michele Glenn DOB: [birth year] Married / Language: English / Race: White Female Date of Admission:  02-09-2014 CC:  Right Knee Pain History of Present Illness The patient is a 58 year old female who comes in for a preoperative History and Physical. The patient is scheduled for a right total knee arthroplasty to be performed by Dr. Dione Glenn. Aluisio, MD at [facility] on 02-09-2014. The patient is a 58 year old female who presents for a follow-up for Follow-up Knee. The patient is being followed for their left osteoarthritis and the patient is several months out from her Left TKR. Symptoms reported today include: pain, swelling, aching and throbbing. and report their pain level to be 3 / 10. Current treatment includes: home exercise program (riding a stationary bike 3X week), activity modification and pain medications (tramadol prn). The following medication has been used for pain control: Ultram. The patient indicates that she is now ready to proceed with the opposite side for surgery. They have been treated conservatively in the past for the above stated problem and despite conservative measures, they continue to have progressive pain and severe functional limitations and dysfunction. They have failed non-operative management including home exercise, medications, and injections. It is felt that they would benefit from undergoing total joint replacement. Risks and benefits of the procedure have been discussed with the patient and they elect to proceed with surgery. There are no active contraindications to surgery such as ongoing infection or rapidly progressive neurological disease.  Problem List/Past Medical  Aftercare following left knee joint replacement surgery (Z47.1) Primary osteoarthritis of right knee (M17.11) Osteoarthritis of left knee (M17.9) Knee pain, bilateral (M25.561, M25.562) Status post total left knee replacement (V78.588) Goiter Thyroid Thyroid carcinoma (C73)  Post Thyroidectomy Degenerative Disc Disease Cervical Anxiety Disorder Heart murmur Hypothyroidism Fibromyalgia  Allergies Contrast Media Ready-Box *MEDICAL DEVICES* Sneezing ( No problemes with topical betadine or iodine) Ketek Pak *Anti-infective Agents - Misc.** Hives. Morphine Sulfate (Concentrate) *ANALGESICS - OPIOID* Nausea, Vomiting. INTOLERANCE  Family History  Cancer Father Deceased. age 50, Ruptured Abdominal Aortic Anerysum Mother Living, Breast cancer.  Social History Exercise Exercises weekly; does individual sport Illicit drug use no Never consumed alcohol 01/05/2013: Never consumed alcohol Tobacco use 01/05/2013 Pain Contract no Previously in rehab no Tobacco / smoke exposure 01/05/2013: no Marital status married Living situation live with spouse Drug/Alcohol Rehab (Currently) no No history of drug/alcohol rehab Not under pain contract [place] Current work status unemployed Alcohol use never consumed alcohol Children 1  Medication History  Hydrocodone-Acetaminophen (5-325MG  Tablet, Oral) Active. Methocarbamol (500MG  Tablet, Oral) Active. Tylenol (prn) Active. Probiotic Active. Vitamin C Active. TraMADol HCl (50MG  Tablet, Oral) Active. Meloxicam (Oral) Specific dose unknown - Active. Folic Acid (Oral) Specific dose unknown - Active. Aspirin EC (Oral) Specific dose unknown - Active. Levothyroxine Sodium (Oral) Specific dose unknown - Active. (100mg ) Sertraline HCl (Oral) Specific dose unknown - Active. Omeprazole (Oral) Specific dose unknown - Active.   Past Surgical History Thyroidectomy; Total Date: 04/2013. Hysterectomy partial (non-cancerous) Carpal Tunnel Repair right Total Knee Replacement - Left Date: 10/2013.  Review of Systems General Not Present- Chills, Fatigue, Fever, Memory Loss, Night Sweats, Weight Gain and Weight Loss. Skin Not Present-  Eczema, Hives, Itching, Lesions and Rash. HEENT Present- Headache. Not Present- Dentures, Double Vision, Hearing Loss, Tinnitus and Visual Loss. Respiratory Not Present- Allergies, Chronic Cough, Coughing up blood, Shortness of breath at rest and Shortness of breath with exertion. Cardiovascular Not Present- Chest Pain,  Difficulty Breathing Lying Down, Murmur, Palpitations, Racing/skipping heartbeats and Swelling. Gastrointestinal Not Present- Abdominal Pain, Bloody Stool, Constipation, Diarrhea, Difficulty Swallowing, Heartburn, Jaundice, Loss of appetitie, Nausea and Vomiting. Female Genitourinary Not Present- Blood in Urine, Discharge, Flank Pain, Incontinence, Painful Urination, Urgency, Urinary frequency, Urinary Retention, Urinating at Night and Weak urinary stream. Musculoskeletal Present- Joint Pain and Joint Swelling. Not Present- Back Pain, Morning Stiffness, Muscle Pain, Muscle Weakness and Spasms. Neurological Not Present- Blackout spells, Difficulty with balance, Dizziness, Paralysis, Tremor and Weakness. Psychiatric Not Present- Insomnia.   Vitals Weight: 160 lb Height: 64in Weight was reported by patient. Height was reported by patient. Body Surface Area: 1.78 m Body Mass Index: 27.46 kg/m  BP: 118/56 (Sitting, Left Arm, Standard)   Physical Exam (Alexzandrew L. Perkins III PA-C; 01/25/2014 2:45 PM) General Mental Status -Alert, cooperative and good historian. General Appearance-pleasant, Not in acute distress. Orientation-Oriented X3. Build & Nutrition-Well nourished and Well developed.  Head and Neck Head-normocephalic, atraumatic . Neck Global Assessment - supple(horizontal healed incision from thyroidectomy), no bruit auscultated on the right, no bruit auscultated on the left.  Eye Vision-Wears corrective lenses. Pupil - Bilateral-Regular and Round. Motion - Bilateral-EOMI.  Chest and Lung Exam Auscultation Breath sounds - clear at  anterior chest wall and clear at posterior chest wall. Adventitious sounds - No Adventitious sounds.  Cardiovascular Auscultation Rhythm - Regular rate and rhythm. Heart Sounds - S1 WNL and S2 WNL. Murmurs & Other Heart Sounds - Auscultation of the heart reveals - No Murmurs.  Abdomen Palpation/Percussion Tenderness - Abdomen is non-tender to palpation. Rigidity (guarding) - Abdomen is soft. Auscultation Auscultation of the abdomen reveals - Bowel sounds normal.  Female Genitourinary Note: Not done, not pertinent to present illness   Musculoskeletal Note: General General Appearance-appears comfortable, Not in acute distress. Orientation-Oriented X3. Mental Status-Alert.  Musculoskeletal Lower Extremity  Right Lower Extremity: Right Knee: ROM: Flexion - AROM - 130 . PROM - 130 . Extension - AROM - 0 . PROM - 0 . Left Lower Extremity: Left Knee: Inspection and Palpation - Effusion - mild. Skin: Wound/Incision - Appearance - The incision is well healed with no erythema or exudates. ROM: Flexion - AROM - 120 . PROM - 130 . Extension - AROM - 7 . PROM - 5 . Gait and Station: Evaluation of Gait - Gait Pattern - normal gait pattern. Gait and Station - [employer] - no assistive devices. Weightbearing - weightbearing as tolerated.  X-RAYS: Reviewed her radiographs that showed severe patellofemoral arthritis in both knees with some lateral compartment spurring in both, worse on the left than the right. (She has since had the left knee replaced).   Assessment & Plan  Primary osteoarthritis of right knee (M17.11) Status post total left knee replacement (G53.646) Note:Plan is for a Right Total Knee Replacement by Dr. Wynelle Link.  Plan is to go home  PCP - Dr. Gaynelle Arabian - Patient has been seen preoperatively and felt to be stable for surgery.  Topical TXA - History of Cancer  Signed electronically by Joelene Millin, III PA-C

## 2014-02-09 ENCOUNTER — Encounter (HOSPITAL_COMMUNITY)
Admission: RE | Disposition: A | Discharge status: Home Health | Payer: Self-pay | Source: Ambulatory Visit | Attending: Orthopedic Surgery

## 2014-02-09 ENCOUNTER — Inpatient Hospital Stay (HOSPITAL_COMMUNITY)
Admission: RE | Admit: 2014-02-09 | Discharge: 2014-02-11 | DRG: 470 | Disposition: A | Discharge status: Home Health | Payer: BC Managed Care – PPO | Source: Ambulatory Visit | Attending: Orthopedic Surgery | Admitting: Orthopedic Surgery

## 2014-02-09 ENCOUNTER — Encounter (HOSPITAL_COMMUNITY): Payer: Self-pay | Admitting: *Deleted

## 2014-02-09 ENCOUNTER — Inpatient Hospital Stay (HOSPITAL_COMMUNITY): Payer: BC Managed Care – PPO | Admitting: Certified Registered Nurse Anesthetist

## 2014-02-09 DIAGNOSIS — M1710 Unilateral primary osteoarthritis, unspecified knee: Secondary | ICD-10-CM | POA: Diagnosis present

## 2014-02-09 DIAGNOSIS — K219 Gastro-esophageal reflux disease without esophagitis: Secondary | ICD-10-CM | POA: Diagnosis present

## 2014-02-09 DIAGNOSIS — Z85850 Personal history of malignant neoplasm of thyroid: Secondary | ICD-10-CM | POA: Diagnosis not present

## 2014-02-09 DIAGNOSIS — M1711 Unilateral primary osteoarthritis, right knee: Secondary | ICD-10-CM

## 2014-02-09 DIAGNOSIS — F419 Anxiety disorder, unspecified: Secondary | ICD-10-CM | POA: Diagnosis present

## 2014-02-09 DIAGNOSIS — M797 Fibromyalgia: Secondary | ICD-10-CM | POA: Diagnosis present

## 2014-02-09 DIAGNOSIS — M179 Osteoarthritis of knee, unspecified: Secondary | ICD-10-CM | POA: Diagnosis present

## 2014-02-09 DIAGNOSIS — Z7901 Long term (current) use of anticoagulants: Secondary | ICD-10-CM | POA: Diagnosis not present

## 2014-02-09 DIAGNOSIS — Z79899 Other long term (current) drug therapy: Secondary | ICD-10-CM

## 2014-02-09 DIAGNOSIS — Z7982 Long term (current) use of aspirin: Secondary | ICD-10-CM

## 2014-02-09 DIAGNOSIS — E039 Hypothyroidism, unspecified: Secondary | ICD-10-CM | POA: Diagnosis present

## 2014-02-09 HISTORY — PX: TOTAL KNEE ARTHROPLASTY: SHX125

## 2014-02-09 LAB — TYPE AND SCREEN
ABO/RH(D): A NEG
Antibody Screen: NEGATIVE

## 2014-02-09 SURGERY — ARTHROPLASTY, KNEE, TOTAL
Anesthesia: Spinal | Site: Knee | Laterality: Right

## 2014-02-09 MED ORDER — PHENYLEPHRINE HCL 10 MG/ML IJ SOLN
10.0000 mg | INTRAVENOUS | Status: DC | PRN
  Administered 2014-02-09: 25 ug/min via INTRAVENOUS

## 2014-02-09 MED ORDER — ACETAMINOPHEN 325 MG PO TABS
650.0000 mg | ORAL_TABLET | Freq: Four times a day (QID) | ORAL | Status: DC | PRN
Start: 2014-02-10 — End: 2014-02-11

## 2014-02-09 MED ORDER — PHENYLEPHRINE HCL 10 MG/ML IJ SOLN
INTRAMUSCULAR | Status: AC
  Filled 2014-02-09: qty 1

## 2014-02-09 MED ORDER — ONDANSETRON HCL 4 MG PO TABS: 4.0000 mg | ORAL_TABLET | Freq: Four times a day (QID) | ORAL | Status: DC | PRN

## 2014-02-09 MED ORDER — SODIUM CHLORIDE 0.9 % IJ SOLN
INTRAMUSCULAR | Status: AC
  Filled 2014-02-09: qty 10

## 2014-02-09 MED ORDER — PHENYLEPHRINE HCL 10 MG/ML IJ SOLN
INTRAMUSCULAR | Status: DC | PRN
  Administered 2014-02-09: 80 ug via INTRAVENOUS

## 2014-02-09 MED ORDER — TRANEXAMIC ACID 100 MG/ML IV SOLN
2000.0000 mg | Freq: Once | INTRAVENOUS | Status: DC
  Filled 2014-02-09: qty 20

## 2014-02-09 MED ORDER — PHENOL 1.4 % MT LIQD
1.0000 | OROMUCOSAL | Status: DC | PRN
  Filled 2014-02-09: qty 177

## 2014-02-09 MED ORDER — BUPIVACAINE IN DEXTROSE 0.75-8.25 % IT SOLN
INTRATHECAL | Status: DC | PRN
  Administered 2014-02-09: 2 mL via INTRATHECAL

## 2014-02-09 MED ORDER — LACTATED RINGERS IV SOLN
INTRAVENOUS | Status: DC | PRN
  Administered 2014-02-09 (×2): via INTRAVENOUS

## 2014-02-09 MED ORDER — ONDANSETRON HCL 4 MG/2ML IJ SOLN: 4.0000 mg | Freq: Four times a day (QID) | INTRAMUSCULAR | Status: DC | PRN

## 2014-02-09 MED ORDER — BUPIVACAINE HCL 0.25 % IJ SOLN
INTRAMUSCULAR | Status: DC | PRN
  Administered 2014-02-09: 20 mL

## 2014-02-09 MED ORDER — BUPIVACAINE LIPOSOME 1.3 % IJ SUSP
20.0000 mL | Freq: Once | INTRAMUSCULAR | Status: AC
  Administered 2014-02-09: 20 mL
  Filled 2014-02-09: qty 20

## 2014-02-09 MED ORDER — MENTHOL 3 MG MT LOZG
1.0000 | LOZENGE | OROMUCOSAL | Status: DC | PRN
  Filled 2014-02-09: qty 9

## 2014-02-09 MED ORDER — ONDANSETRON HCL 4 MG/2ML IJ SOLN
INTRAMUSCULAR | Status: DC | PRN
  Administered 2014-02-09: 4 mg via INTRAVENOUS

## 2014-02-09 MED ORDER — ONDANSETRON HCL 4 MG/2ML IJ SOLN
INTRAMUSCULAR | Status: AC
  Filled 2014-02-09: qty 2

## 2014-02-09 MED ORDER — DIPHENHYDRAMINE HCL 12.5 MG/5ML PO ELIX
12.5000 mg | ORAL_SOLUTION | ORAL | Status: DC | PRN
  Administered 2014-02-10 – 2014-02-11 (×4): 25 mg via ORAL
  Filled 2014-02-09 (×4): qty 10

## 2014-02-09 MED ORDER — FENTANYL CITRATE 0.05 MG/ML IJ SOLN
INTRAMUSCULAR | Status: AC
  Filled 2014-02-09: qty 2

## 2014-02-09 MED ORDER — BUPIVACAINE HCL (PF) 0.25 % IJ SOLN
INTRAMUSCULAR | Status: AC
  Filled 2014-02-09: qty 30

## 2014-02-09 MED ORDER — CEFAZOLIN SODIUM-DEXTROSE 2-3 GM-% IV SOLR
INTRAVENOUS | Status: AC
  Filled 2014-02-09: qty 50

## 2014-02-09 MED ORDER — TRANEXAMIC ACID 100 MG/ML IV SOLN
2000.0000 mg | INTRAVENOUS | Status: DC | PRN
  Administered 2014-02-09: 2000 mg via TOPICAL

## 2014-02-09 MED ORDER — TRANEXAMIC ACID 100 MG/ML IV SOLN: 2000.0000 mg | Freq: Once | INTRAVENOUS | Status: DC

## 2014-02-09 MED ORDER — POLYETHYLENE GLYCOL 3350 17 G PO PACK: 17.0000 g | PACK | Freq: Every day | ORAL | Status: DC | PRN

## 2014-02-09 MED ORDER — PROPOFOL 10 MG/ML IV BOLUS
INTRAVENOUS | Status: AC
  Filled 2014-02-09: qty 20

## 2014-02-09 MED ORDER — BISACODYL 10 MG RE SUPP: 10.0000 mg | Freq: Every day | RECTAL | Status: DC | PRN

## 2014-02-09 MED ORDER — ACETAMINOPHEN 500 MG PO TABS
1000.0000 mg | ORAL_TABLET | Freq: Four times a day (QID) | ORAL | Status: AC
  Administered 2014-02-09 – 2014-02-10 (×3): 1000 mg via ORAL
  Filled 2014-02-09 (×3): qty 2

## 2014-02-09 MED ORDER — MIDAZOLAM HCL 2 MG/2ML IJ SOLN
INTRAMUSCULAR | Status: AC
  Filled 2014-02-09: qty 2

## 2014-02-09 MED ORDER — CEFAZOLIN SODIUM-DEXTROSE 2-3 GM-% IV SOLR
2.0000 g | Freq: Four times a day (QID) | INTRAVENOUS | Status: AC
  Administered 2014-02-09 (×2): 2 g via INTRAVENOUS
  Filled 2014-02-09 (×2): qty 50

## 2014-02-09 MED ORDER — DEXAMETHASONE SODIUM PHOSPHATE 10 MG/ML IJ SOLN
10.0000 mg | Freq: Once | INTRAMUSCULAR | Status: AC
  Administered 2014-02-09: 10 mg via INTRAVENOUS

## 2014-02-09 MED ORDER — TRAMADOL HCL 50 MG PO TABS
50.0000 mg | ORAL_TABLET | Freq: Four times a day (QID) | ORAL | Status: DC | PRN
  Administered 2014-02-10: 50 mg via ORAL
  Administered 2014-02-11: 100 mg via ORAL
  Filled 2014-02-09: qty 2
  Filled 2014-02-09: qty 1

## 2014-02-09 MED ORDER — SODIUM CHLORIDE 0.9 % IJ SOLN
INTRAMUSCULAR | Status: AC
  Filled 2014-02-09: qty 50

## 2014-02-09 MED ORDER — LEVOTHYROXINE SODIUM 100 MCG PO TABS
100.0000 ug | ORAL_TABLET | Freq: Every day | ORAL | Status: DC
  Administered 2014-02-10 – 2014-02-11 (×2): 100 ug via ORAL
  Filled 2014-02-09 (×3): qty 1

## 2014-02-09 MED ORDER — DEXTROSE-NACL 5-0.9 % IV SOLN
INTRAVENOUS | Status: DC
  Administered 2014-02-09: 13:00:00 via INTRAVENOUS

## 2014-02-09 MED ORDER — FENTANYL CITRATE 0.05 MG/ML IJ SOLN: 25.0000 ug | INTRAMUSCULAR | Status: DC | PRN

## 2014-02-09 MED ORDER — FLEET ENEMA 7-19 GM/118ML RE ENEM: 1.0000 | ENEMA | Freq: Once | RECTAL | Status: AC | PRN

## 2014-02-09 MED ORDER — HYDROMORPHONE HCL 1 MG/ML IJ SOLN
0.5000 mg | INTRAMUSCULAR | Status: DC | PRN
  Administered 2014-02-10: 0.5 mg via INTRAVENOUS
  Filled 2014-02-09: qty 1

## 2014-02-09 MED ORDER — DEXAMETHASONE SODIUM PHOSPHATE 10 MG/ML IJ SOLN
10.0000 mg | Freq: Once | INTRAMUSCULAR | Status: AC
  Administered 2014-02-10: 10 mg via INTRAVENOUS
  Filled 2014-02-09: qty 1

## 2014-02-09 MED ORDER — KETOROLAC TROMETHAMINE 15 MG/ML IJ SOLN
7.5000 mg | Freq: Four times a day (QID) | INTRAMUSCULAR | Status: AC | PRN
  Administered 2014-02-09: 7.5 mg via INTRAVENOUS
  Filled 2014-02-09: qty 1

## 2014-02-09 MED ORDER — PROPOFOL INFUSION 10 MG/ML OPTIME
INTRAVENOUS | Status: DC | PRN
  Administered 2014-02-09: 100 ug/kg/min via INTRAVENOUS

## 2014-02-09 MED ORDER — PHENYLEPHRINE 40 MCG/ML (10ML) SYRINGE FOR IV PUSH (FOR BLOOD PRESSURE SUPPORT)
PREFILLED_SYRINGE | INTRAVENOUS | Status: AC
  Filled 2014-02-09: qty 10

## 2014-02-09 MED ORDER — OXYCODONE HCL 5 MG PO TABS
5.0000 mg | ORAL_TABLET | ORAL | Status: DC | PRN
  Administered 2014-02-09 – 2014-02-11 (×12): 10 mg via ORAL
  Filled 2014-02-09 (×12): qty 2

## 2014-02-09 MED ORDER — LIDOCAINE HCL (CARDIAC) 20 MG/ML IV SOLN
INTRAVENOUS | Status: DC | PRN
  Administered 2014-02-09: 100 mg via INTRAVENOUS

## 2014-02-09 MED ORDER — PANTOPRAZOLE SODIUM 40 MG PO TBEC
80.0000 mg | DELAYED_RELEASE_TABLET | Freq: Every day | ORAL | Status: DC
  Administered 2014-02-09: 80 mg via ORAL
  Filled 2014-02-09 (×2): qty 2

## 2014-02-09 MED ORDER — METOCLOPRAMIDE HCL 5 MG/ML IJ SOLN: 5.0000 mg | Freq: Three times a day (TID) | INTRAMUSCULAR | Status: DC | PRN

## 2014-02-09 MED ORDER — METHOCARBAMOL 1000 MG/10ML IJ SOLN
500.0000 mg | Freq: Four times a day (QID) | INTRAVENOUS | Status: DC | PRN
  Administered 2014-02-09: 500 mg via INTRAVENOUS
  Filled 2014-02-09 (×2): qty 5

## 2014-02-09 MED ORDER — RIVAROXABAN 10 MG PO TABS
10.0000 mg | ORAL_TABLET | Freq: Every day | ORAL | Status: DC
  Administered 2014-02-10 – 2014-02-11 (×2): 10 mg via ORAL
  Filled 2014-02-09 (×3): qty 1

## 2014-02-09 MED ORDER — MIDAZOLAM HCL 5 MG/5ML IJ SOLN
INTRAMUSCULAR | Status: DC | PRN
  Administered 2014-02-09: 2 mg via INTRAVENOUS

## 2014-02-09 MED ORDER — EPHEDRINE SULFATE 50 MG/ML IJ SOLN
INTRAMUSCULAR | Status: DC | PRN
  Administered 2014-02-09: 10 mg via INTRAVENOUS

## 2014-02-09 MED ORDER — CEFAZOLIN SODIUM-DEXTROSE 2-3 GM-% IV SOLR
2.0000 g | INTRAVENOUS | Status: AC
  Administered 2014-02-09: 2 g via INTRAVENOUS

## 2014-02-09 MED ORDER — SODIUM CHLORIDE 0.9 % IR SOLN
Status: DC | PRN
  Administered 2014-02-09: 1000 mL

## 2014-02-09 MED ORDER — PROMETHAZINE HCL 25 MG/ML IJ SOLN: 6.2500 mg | INTRAMUSCULAR | Status: DC | PRN

## 2014-02-09 MED ORDER — EPHEDRINE SULFATE 50 MG/ML IJ SOLN
INTRAMUSCULAR | Status: AC
  Filled 2014-02-09: qty 1

## 2014-02-09 MED ORDER — SODIUM CHLORIDE 0.9 % IJ SOLN
INTRAMUSCULAR | Status: DC | PRN
  Administered 2014-02-09: 30 mL

## 2014-02-09 MED ORDER — ACETAMINOPHEN 650 MG RE SUPP: 650.0000 mg | Freq: Four times a day (QID) | RECTAL | Status: DC | PRN

## 2014-02-09 MED ORDER — LIDOCAINE HCL (CARDIAC) 20 MG/ML IV SOLN
INTRAVENOUS | Status: AC
  Filled 2014-02-09: qty 5

## 2014-02-09 MED ORDER — SERTRALINE HCL 50 MG PO TABS
50.0000 mg | ORAL_TABLET | Freq: Every day | ORAL | Status: DC
  Administered 2014-02-09 – 2014-02-10 (×2): 50 mg via ORAL
  Filled 2014-02-09 (×3): qty 1

## 2014-02-09 MED ORDER — 0.9 % SODIUM CHLORIDE (POUR BTL) OPTIME
TOPICAL | Status: DC | PRN
  Administered 2014-02-09: 1000 mL

## 2014-02-09 MED ORDER — DOCUSATE SODIUM 100 MG PO CAPS
100.0000 mg | ORAL_CAPSULE | Freq: Two times a day (BID) | ORAL | Status: DC
  Administered 2014-02-09 – 2014-02-11 (×4): 100 mg via ORAL

## 2014-02-09 MED ORDER — METHOCARBAMOL 500 MG PO TABS
500.0000 mg | ORAL_TABLET | Freq: Four times a day (QID) | ORAL | Status: DC | PRN
  Administered 2014-02-09 – 2014-02-11 (×6): 500 mg via ORAL
  Filled 2014-02-09 (×6): qty 1

## 2014-02-09 MED ORDER — METOCLOPRAMIDE HCL 10 MG PO TABS: 5.0000 mg | ORAL_TABLET | Freq: Three times a day (TID) | ORAL | Status: DC | PRN

## 2014-02-09 MED ORDER — DOCUSATE SODIUM 100 MG PO CAPS: 100.0000 mg | ORAL_CAPSULE | Freq: Two times a day (BID) | ORAL | Status: DC

## 2014-02-09 MED ORDER — FENTANYL CITRATE 0.05 MG/ML IJ SOLN
INTRAMUSCULAR | Status: DC | PRN
  Administered 2014-02-09 (×2): 50 ug via INTRAVENOUS

## 2014-02-09 MED ORDER — SODIUM CHLORIDE 0.9 % IV SOLN: INTRAVENOUS | Status: DC

## 2014-02-09 MED ORDER — ACETAMINOPHEN 10 MG/ML IV SOLN
1000.0000 mg | Freq: Once | INTRAVENOUS | Status: AC
  Administered 2014-02-09: 1000 mg via INTRAVENOUS
  Filled 2014-02-09: qty 100

## 2014-02-09 SURGICAL SUPPLY — 62 items
BAG DECANTER FOR FLEXI CONT (MISCELLANEOUS) ×1 IMPLANT
BAG SPEC THK2 15X12 ZIP CLS (MISCELLANEOUS)
BAG ZIPLOCK 12X15 (MISCELLANEOUS) ×1 IMPLANT
BANDAGE ELASTIC 6 VELCRO ST LF (GAUZE/BANDAGES/DRESSINGS) ×2 IMPLANT
BANDAGE ESMARK 6X9 LF (GAUZE/BANDAGES/DRESSINGS) ×1 IMPLANT
BLADE SAG 18X100X1.27 (BLADE) ×2 IMPLANT
BLADE SAW SGTL 11.0X1.19X90.0M (BLADE) ×2 IMPLANT
BNDG CMPR 9X6 STRL LF SNTH (GAUZE/BANDAGES/DRESSINGS) ×1
BNDG ESMARK 6X9 LF (GAUZE/BANDAGES/DRESSINGS) ×2
BOWL SMART MIX CTS (DISPOSABLE) ×2 IMPLANT
CAPT KNEE TOTAL 3 ATTUNE ×1 IMPLANT
CEMENT HV SMART SET (Cement) ×4 IMPLANT
CUFF TOURN SGL QUICK 34 (TOURNIQUET CUFF) ×2
CUFF TRNQT CYL 34X4X40X1 (TOURNIQUET CUFF) ×1 IMPLANT
DECANTER SPIKE VIAL GLASS SM (MISCELLANEOUS) ×2 IMPLANT
DRAPE EXTREMITY T 121X128X90 (DRAPE) ×2 IMPLANT
DRAPE POUCH INSTRU U-SHP 10X18 (DRAPES) ×2 IMPLANT
DRAPE U-SHAPE 47X51 STRL (DRAPES) ×2 IMPLANT
DRSG ADAPTIC 3X8 NADH LF (GAUZE/BANDAGES/DRESSINGS) ×2 IMPLANT
DRSG PAD ABDOMINAL 8X10 ST (GAUZE/BANDAGES/DRESSINGS) ×2 IMPLANT
DURAPREP 26ML APPLICATOR (WOUND CARE) ×2 IMPLANT
ELECT REM PT RETURN 9FT ADLT (ELECTROSURGICAL) ×2
ELECTRODE REM PT RTRN 9FT ADLT (ELECTROSURGICAL) ×1 IMPLANT
EVACUATOR 1/8 PVC DRAIN (DRAIN) ×2 IMPLANT
FACESHIELD WRAPAROUND (MASK) ×10 IMPLANT
FACESHIELD WRAPAROUND OR TEAM (MASK) ×5 IMPLANT
GAUZE SPONGE 4X4 12PLY STRL (GAUZE/BANDAGES/DRESSINGS) ×2 IMPLANT
GLOVE BIO SURGEON STRL SZ7.5 (GLOVE) ×2 IMPLANT
GLOVE BIO SURGEON STRL SZ8 (GLOVE) ×2 IMPLANT
GLOVE BIOGEL PI IND STRL 6.5 (GLOVE) IMPLANT
GLOVE BIOGEL PI IND STRL 7.0 (GLOVE) IMPLANT
GLOVE BIOGEL PI IND STRL 8 (GLOVE) ×1 IMPLANT
GLOVE BIOGEL PI INDICATOR 6.5 (GLOVE) ×1
GLOVE BIOGEL PI INDICATOR 7.0 (GLOVE) ×1
GLOVE BIOGEL PI INDICATOR 8 (GLOVE) ×2
GOWN STRL REUS W/TWL LRG LVL3 (GOWN DISPOSABLE) ×3 IMPLANT
GOWN STRL REUS W/TWL XL LVL3 (GOWN DISPOSABLE) ×2 IMPLANT
HANDPIECE INTERPULSE COAX TIP (DISPOSABLE) ×2
IMMOBILIZER KNEE 20 (SOFTGOODS) ×2
IMMOBILIZER KNEE 20 THIGH 36 (SOFTGOODS) ×1 IMPLANT
KIT BASIN OR (CUSTOM PROCEDURE TRAY) ×2 IMPLANT
MANIFOLD NEPTUNE II (INSTRUMENTS) ×2 IMPLANT
NDL SAFETY ECLIPSE 18X1.5 (NEEDLE) ×2 IMPLANT
NEEDLE HYPO 18GX1.5 SHARP (NEEDLE) ×6
NS IRRIG 1000ML POUR BTL (IV SOLUTION) ×2 IMPLANT
PACK TOTAL JOINT (CUSTOM PROCEDURE TRAY) ×2 IMPLANT
PADDING CAST COTTON 6X4 STRL (CAST SUPPLIES) ×5 IMPLANT
POSITIONER SURGICAL ARM (MISCELLANEOUS) ×2 IMPLANT
SET HNDPC FAN SPRY TIP SCT (DISPOSABLE) ×1 IMPLANT
STRIP CLOSURE SKIN 1/2X4 (GAUZE/BANDAGES/DRESSINGS) ×4 IMPLANT
SUCTION FRAZIER 12FR DISP (SUCTIONS) ×2 IMPLANT
SUT MNCRL AB 4-0 PS2 18 (SUTURE) ×2 IMPLANT
SUT VIC AB 2-0 CT1 27 (SUTURE) ×6
SUT VIC AB 2-0 CT1 TAPERPNT 27 (SUTURE) ×3 IMPLANT
SUT VLOC 180 0 24IN GS25 (SUTURE) ×2 IMPLANT
SYR 20CC LL (SYRINGE) ×2 IMPLANT
SYR 50ML LL SCALE MARK (SYRINGE) ×2 IMPLANT
TOWEL OR 17X26 10 PK STRL BLUE (TOWEL DISPOSABLE) ×2 IMPLANT
TOWEL OR NON WOVEN STRL DISP B (DISPOSABLE) ×1 IMPLANT
TRAY FOLEY CATH 14FRSI W/METER (CATHETERS) ×2 IMPLANT
WATER STERILE IRR 1500ML POUR (IV SOLUTION) ×2 IMPLANT
WRAP KNEE MAXI GEL POST OP (GAUZE/BANDAGES/DRESSINGS) ×2 IMPLANT

## 2014-02-09 NOTE — Op Note (Signed)
Pre-operative diagnosis- Osteoarthritis  Right knee(s)  Post-operative diagnosis- Osteoarthritis Right knee(s)  Procedure-  Right  Total Knee Arthroplasty  Surgeon- Dione Plover. Zali Kamaka, MD  Assistant- Arlee Muslim, PA-C   Anesthesia-  Spinal  EBL-* No blood loss amount entered *   Drains Hemovac  Tourniquet time-  Total Tourniquet Time Documented: Thigh (Right) - 34 minutes Total: Thigh (Right) - 34 minutes     Complications- None  Condition-PACU - hemodynamically stable.   Brief Clinical Note  Michele Glenn is a 58 y.o. year old female with end stage OA of her right knee with progressively worsening pain and dysfunction. She has constant pain, with activity and at rest and significant functional deficits with difficulties even with ADLs. She has had extensive non-op management including analgesics, injections of cortisone and viscosupplements, and home exercise program, but remains in significant pain with significant dysfunction.Radiographs show bone on bone arthritis patellofemoral. She presents now for right Total Knee Arthroplasty.    Procedure in detail---   The patient is brought into the operating room and positioned supine on the operating table. After successful administration of  Spinal,   a tourniquet is placed high on the  Right thigh(s) and the lower extremity is prepped and draped in the usual sterile fashion. Time out is performed by the operating team and then the  Right lower extremity is wrapped in Esmarch, knee flexed and the tourniquet inflated to 300 mmHg.       A midline incision is made with a ten blade through the subcutaneous tissue to the level of the extensor mechanism. A fresh blade is used to make a medial parapatellar arthrotomy. Soft tissue over the proximal medial tibia is subperiosteally elevated to the joint line with a knife and into the semimembranosus bursa with a Cobb elevator. Soft tissue over the proximal lateral tibia is elevated with attention  being paid to avoiding the patellar tendon on the tibial tubercle. The patella is everted, knee flexed 90 degrees and the ACL and PCL are removed. Findings are bone on bone patellofemoral with exposed bone all 3 compartments and large global osteophytes.        The drill is used to create a starting hole in the distal femur and the canal is thoroughly irrigated with sterile saline to remove the fatty contents. The 5 degree Right  valgus alignment guide is placed into the femoral canal and the distal femoral cutting block is pinned to remove 9 mm off the distal femur. Resection is made with an oscillating saw.      The tibia is subluxed forward and the menisci are removed. The extramedullary alignment guide is placed referencing proximally at the medial aspect of the tibial tubercle and distally along the second metatarsal axis and tibial crest. The block is pinned to remove 45mm off the more deficient medial  side. Resection is made with an oscillating saw. Size 4is the most appropriate size for the tibia and the proximal tibia is prepared with the modular drill and keel punch for that size.      The femoral sizing guide is placed and size 4 is most appropriate. Rotation is marked off the epicondylar axis and confirmed by creating a rectangular flexion gap at 90 degrees. The size 4 cutting block is pinned in this rotation and the anterior, posterior and chamfer cuts are made with the oscillating saw. The intercondylar block is then placed and that cut is made.      Trial size 4 tibial  component, trial size 4 posterior stabilized femur and a 6  mm posterior stabilized rotating platform insert trial is placed. Full extension is achieved with excellent varus/valgus and anterior/posterior balance throughout full range of motion. The patella is everted and thickness measured to be 22  mm. Free hand resection is taken to 12 mm, a 35 template is placed, lug holes are drilled, trial patella is placed, and it tracks  normally. Osteophytes are removed off the posterior femur with the trial in place. All trials are removed and the cut bone surfaces prepared with pulsatile lavage. Cement is mixed and once ready for implantation, the size 4 tibial implant, size  4 narrow posterior stabilized femoral component, and the size 35 patella are cemented in place and the patella is held with the clamp. The trial insert is placed and the knee held in full extension. The Exparel (20 ml mixed with 30 ml saline) and .25% Bupivicaine, are injected into the extensor mechanism, posterior capsule, medial and lateral gutters and subcutaneous tissues.  All extruded cement is removed and once the cement is hard the permanent 6 mm posterior stabilized rotating platform insert is placed into the tibial tray.      The wound is copiously irrigated with saline solution and the extensor mechanism closed over a hemovac drain with #1 V-loc suture. The tourniquet is released for a total tourniquet time of 34  minutes. Flexion against gravity is 140 degrees and the patella tracks normally. Subcutaneous tissue is closed with 2.0 vicryl and subcuticular with running 4.0 Monocryl. The incision is cleaned and dried and steri-strips and a bulky sterile dressing are applied. The limb is placed into a knee immobilizer and the patient is awakened and transported to recovery in stable condition.      Please note that a surgical assistant was a medical necessity for this procedure in order to perform it in a safe and expeditious manner. Surgical assistant was necessary to retract the ligaments and vital neurovascular structures to prevent injury to them and also necessary for proper positioning of the limb to allow for anatomic placement of the prosthesis.   Dione Plover Saige Busby, MD    02/09/2014, 9:43 AM

## 2014-02-09 NOTE — Anesthesia Postprocedure Evaluation (Signed)
  Anesthesia Post-op Note  Patient: Michele Glenn  Procedure(s) Performed: Procedure(s) (LRB): RIGHT TOTAL KNEE ARTHROPLASTY (Right)  Patient Location: PACU  Anesthesia Type: Spinal  Level of Consciousness: awake and alert   Airway and Oxygen Therapy: Patient Spontanous Breathing  Post-op Pain: mild  Post-op Assessment: Post-op Vital signs reviewed, Patient's Cardiovascular Status Stable, Respiratory Function Stable, Patent Airway and No signs of Nausea or vomiting  Last Vitals:  Filed Vitals:   02/09/14 1145  BP: 127/71  Pulse: 83  Temp:   Resp: 15    Post-op Vital Signs: stable   Complications: No apparent anesthesia complications

## 2014-02-09 NOTE — Anesthesia Preprocedure Evaluation (Signed)
Anesthesia Evaluation  Patient identified by MRN, date of birth, ID band Patient awake    Reviewed: Allergy & Precautions, H&P , NPO status , Patient's Chart, lab work & pertinent test results  History of Anesthesia Complications (+) PONV and history of anesthetic complications  Airway Mallampati: II  TM Distance: >3 FB Neck ROM: Full    Dental no notable dental hx.    Pulmonary neg pulmonary ROS,  breath sounds clear to auscultation  Pulmonary exam normal       Cardiovascular negative cardio ROS  Rhythm:Regular Rate:Normal     Neuro/Psych  Headaches, Anxiety  Neuromuscular disease    GI/Hepatic Neg liver ROS, GERD-  Medicated,  Endo/Other  Hypothyroidism   Renal/GU negative Renal ROS  negative genitourinary   Musculoskeletal  (+) Arthritis -, Fibromyalgia -  Abdominal   Peds negative pediatric ROS (+)  Hematology  (+) anemia ,   Anesthesia Other Findings   Reproductive/Obstetrics negative OB ROS                             Anesthesia Physical Anesthesia Plan  ASA: II  Anesthesia Plan: Spinal   Post-op Pain Management:    Induction: Intravenous  Airway Management Planned:   Additional Equipment:   Intra-op Plan:   Post-operative Plan: Extubation in OR  Informed Consent: I have reviewed the patients History and Physical, chart, labs and discussed the procedure including the risks, benefits and alternatives for the proposed anesthesia with the patient or authorized representative who has indicated his/her understanding and acceptance.   Dental advisory given  Plan Discussed with: CRNA  Anesthesia Plan Comments: (Discussed general and spinal. Discussed risks/benefits of spinal including headache, backache, failure, bleeding, infection, and nerve damage. Patient consents to spinal. Questions answered. Coagulation studies and platelet count acceptable.)         Anesthesia Quick Evaluation

## 2014-02-09 NOTE — Plan of Care (Signed)
Problem: Consults Goal: Total Joint Replacement Patient Education See Patient Education Module for education specifics.  Outcome: Completed/Met Date Met:  02/09/14 Goal: Diagnosis- Total Joint Replacement Outcome: Completed/Met Date Met:  02/09/14 Primary Total Knee RIGHT Goal: Skin Care Protocol Initiated - if Braden Score 18 or less If consults are not indicated, leave blank or document N/A  Outcome: Not Applicable Date Met:  88/75/79 Goal: Nutrition Consult-if indicated Outcome: Not Applicable Date Met:  72/82/06 Goal: Diabetes Guidelines if Diabetic/Glucose > 140 If diabetic or lab glucose is > 140 mg/dl - Initiate Diabetes/Hyperglycemia Guidelines & Document Interventions  Outcome: Not Applicable Date Met:  01/56/15  Problem: Phase I Progression Outcomes Goal: Pain controlled with appropriate interventions Outcome: Completed/Met Date Met:  02/09/14 Goal: Dangle or out of bed evening of surgery Outcome: Completed/Met Date Met:  02/09/14 Goal: Other Phase I Outcomes/Goals Outcome: Not Applicable Date Met:  37/94/32  Problem: Phase II Progression Outcomes Goal: Tolerating diet Outcome: Completed/Met Date Met:  02/09/14 Goal: Discharge plan established Outcome: Completed/Met Date Met:  02/09/14

## 2014-02-09 NOTE — Interval H&P Note (Signed)
History and Physical Interval Note:  02/09/2014 8:20 AM  Michele Glenn  has presented today for surgery, with the diagnosis of OA RIGHT KNEE  The various methods of treatment have been discussed with the patient and family. After consideration of risks, benefits and other options for treatment, the patient has consented to  Procedure(s): RIGHT TOTAL KNEE ARTHROPLASTY (Right) as a surgical intervention .  The patient's history has been reviewed, patient examined, no change in status, stable for surgery.  I have reviewed the patient's chart and labs.  Questions were answered to the patient's satisfaction.     Gearlean Alf

## 2014-02-09 NOTE — H&P (View-Only) (Signed)
Michele Glenn DOB: 11/27/1955 Married / Language: English / Race: White Female Date of Admission:  02-09-2014 CC:  Right Knee Pain History of Present Illness The patient is a 58 year old female who comes in for a preoperative History and Physical. The patient is scheduled for a right total knee arthroplasty to be performed by Dr. Dione Plover. Aluisio, MD at [facility] on 02-09-2014. The patient is a 58 year old female who presents for a follow-up for Follow-up Knee. The patient is being followed for their left osteoarthritis and the patient is several months out from her Left TKR. Symptoms reported today include: pain, swelling, aching and throbbing. and report their pain level to be 3 / 10. Current treatment includes: home exercise program (riding a stationary bike 3X week), activity modification and pain medications (tramadol prn). The following medication has been used for pain control: Ultram. The patient indicates that she is now ready to proceed with the opposite side for surgery. They have been treated conservatively in the past for the above stated problem and despite conservative measures, they continue to have progressive pain and severe functional limitations and dysfunction. They have failed non-operative management including home exercise, medications, and injections. It is felt that they would benefit from undergoing total joint replacement. Risks and benefits of the procedure have been discussed with the patient and they elect to proceed with surgery. There are no active contraindications to surgery such as ongoing infection or rapidly progressive neurological disease.  Problem List/Past Medical  Aftercare following left knee joint replacement surgery (Z47.1) Primary osteoarthritis of right knee (M17.11) Osteoarthritis of left knee (M17.9) Knee pain, bilateral (M25.561, M25.562) Status post total left knee replacement (V56.433) Goiter Thyroid Thyroid carcinoma (C73)  Post Thyroidectomy Degenerative Disc Disease Cervical Anxiety Disorder Heart murmur Hypothyroidism Fibromyalgia  Allergies Contrast Media Ready-Box *MEDICAL DEVICES* Sneezing ( No problemes with topical betadine or iodine) Ketek Pak *Anti-infective Agents - Misc.** Hives. Morphine Sulfate (Concentrate) *ANALGESICS - OPIOID* Nausea, Vomiting. INTOLERANCE  Family History  Cancer Father Deceased. age 65, Ruptured Abdominal Aortic Anerysum Mother Living, Breast cancer.  Social History Exercise Exercises weekly; does individual sport Illicit drug use no Never consumed alcohol 01/05/2013: Never consumed alcohol Tobacco use 01/05/2013 Pain Contract no Previously in rehab no Tobacco / smoke exposure 01/05/2013: no Marital status married Living situation live with spouse Drug/Alcohol Rehab (Currently) no No history of drug/alcohol rehab Not under pain contract [place] Current work status unemployed Alcohol use never consumed alcohol Children 1  Medication History  Hydrocodone-Acetaminophen (5-325MG  Tablet, Oral) Active. Methocarbamol (500MG  Tablet, Oral) Active. Tylenol (prn) Active. Probiotic Active. Vitamin C Active. TraMADol HCl (50MG  Tablet, Oral) Active. Meloxicam (Oral) Specific dose unknown - Active. Folic Acid (Oral) Specific dose unknown - Active. Aspirin EC (Oral) Specific dose unknown - Active. Levothyroxine Sodium (Oral) Specific dose unknown - Active. (100mg ) Sertraline HCl (Oral) Specific dose unknown - Active. Omeprazole (Oral) Specific dose unknown - Active.   Past Surgical History Thyroidectomy; Total Date: 04/2013. Hysterectomy partial (non-cancerous) Carpal Tunnel Repair right Total Knee Replacement - Left Date: 10/2013.  Review of Systems General Not Present- Chills, Fatigue, Fever, Memory Loss, Night Sweats, Weight Gain and Weight Loss. Skin Not Present-  Eczema, Hives, Itching, Lesions and Rash. HEENT Present- Headache. Not Present- Dentures, Double Vision, Hearing Loss, Tinnitus and Visual Loss. Respiratory Not Present- Allergies, Chronic Cough, Coughing up blood, Shortness of breath at rest and Shortness of breath with exertion. Cardiovascular Not Present- Chest Pain,  Difficulty Breathing Lying Down, Murmur, Palpitations, Racing/skipping heartbeats and Swelling. Gastrointestinal Not Present- Abdominal Pain, Bloody Stool, Constipation, Diarrhea, Difficulty Swallowing, Heartburn, Jaundice, Loss of appetitie, Nausea and Vomiting. Female Genitourinary Not Present- Blood in Urine, Discharge, Flank Pain, Incontinence, Painful Urination, Urgency, Urinary frequency, Urinary Retention, Urinating at Night and Weak urinary stream. Musculoskeletal Present- Joint Pain and Joint Swelling. Not Present- Back Pain, Morning Stiffness, Muscle Pain, Muscle Weakness and Spasms. Neurological Not Present- Blackout spells, Difficulty with balance, Dizziness, Paralysis, Tremor and Weakness. Psychiatric Not Present- Insomnia.   Vitals Weight: 160 lb Height: 64in Weight was reported by patient. Height was reported by patient. Body Surface Area: 1.78 m Body Mass Index: 27.46 kg/m  BP: 118/56 (Sitting, Left Arm, Standard)   Physical Exam (Aspen Deterding L. Vyron Fronczak III PA-C; 01/25/2014 2:45 PM) General Mental Status -Alert, cooperative and good historian. General Appearance-pleasant, Not in acute distress. Orientation-Oriented X3. Build & Nutrition-Well nourished and Well developed.  Head and Neck Head-normocephalic, atraumatic . Neck Global Assessment - supple(horizontal healed incision from thyroidectomy), no bruit auscultated on the right, no bruit auscultated on the left.  Eye Vision-Wears corrective lenses. Pupil - Bilateral-Regular and Round. Motion - Bilateral-EOMI.  Chest and Lung Exam Auscultation Breath sounds - clear at  anterior chest wall and clear at posterior chest wall. Adventitious sounds - No Adventitious sounds.  Cardiovascular Auscultation Rhythm - Regular rate and rhythm. Heart Sounds - S1 WNL and S2 WNL. Murmurs & Other Heart Sounds - Auscultation of the heart reveals - No Murmurs.  Abdomen Palpation/Percussion Tenderness - Abdomen is non-tender to palpation. Rigidity (guarding) - Abdomen is soft. Auscultation Auscultation of the abdomen reveals - Bowel sounds normal.  Female Genitourinary Note: Not done, not pertinent to present illness   Musculoskeletal Note: General General Appearance-appears comfortable, Not in acute distress. Orientation-Oriented X3. Mental Status-Alert.  Musculoskeletal Lower Extremity  Right Lower Extremity: Right Knee: ROM: Flexion - AROM - 130 . PROM - 130 . Extension - AROM - 0 . PROM - 0 . Left Lower Extremity: Left Knee: Inspection and Palpation - Effusion - mild. Skin: Wound/Incision - Appearance - The incision is well healed with no erythema or exudates. ROM: Flexion - AROM - 120 . PROM - 130 . Extension - AROM - 7 . PROM - 5 . Gait and Station: Evaluation of Gait - Gait Pattern - normal gait pattern. Gait and Station - [employer] - no assistive devices. Weightbearing - weightbearing as tolerated.  X-RAYS: Reviewed her radiographs that showed severe patellofemoral arthritis in both knees with some lateral compartment spurring in both, worse on the left than the right. (She has since had the left knee replaced).   Assessment & Plan  Primary osteoarthritis of right knee (M17.11) Status post total left knee replacement (E15.830) Note:Plan is for a Right Total Knee Replacement by Dr. Wynelle Link.  Plan is to go home  PCP - Dr. Gaynelle Arabian - Patient has been seen preoperatively and felt to be stable for surgery.  Topical TXA - History of Cancer  Signed electronically by Joelene Millin, III PA-C

## 2014-02-09 NOTE — Anesthesia Procedure Notes (Signed)
Spinal  Start time: 02/09/2014 8:42 AM Staffing Resident/CRNA: Maxwell Caul Performed by: resident/CRNA  Spinal Block Patient position: sitting Prep: Betadine Patient monitoring: heart rate, continuous pulse ox and blood pressure Approach: midline Location: L3-4 Injection technique: single-shot Needle Needle type: Sprotte  Needle gauge: 24 G Needle length: 9 cm Assessment Sensory level: T8 Additional Notes Expiration date of kit checked and confirmed. Patient tolerated procedure well without complaints. CSF clear. No paresthesis noted. Meaningful verbal contact maintained throughout block placement.

## 2014-02-09 NOTE — Transfer of Care (Signed)
Immediate Anesthesia Transfer of Care Note  Patient: Michele Glenn  Procedure(s) Performed: Procedure(s) (LRB): RIGHT TOTAL KNEE ARTHROPLASTY (Right)  Patient Location: PACU  Anesthesia Type: Spinal  Level of Consciousness: sedated, patient cooperative and responds to stimulation  Airway & Oxygen Therapy: Patient Spontanous Breathing and Patient connected to face mask oxgen  Post-op Assessment: Report given to PACU RN and Post -op Vital signs reviewed and stable  Post vital signs: Reviewed and stable  Complications: No apparent anesthesia complications

## 2014-02-09 NOTE — Progress Notes (Signed)
Utilization review completed.  

## 2014-02-09 NOTE — Plan of Care (Signed)
Problem: Consults Goal: Diagnosis- Total Joint Replacement Right total knee  Problem: Phase I Progression Outcomes Goal: CMS/Neurovascular status WDL Outcome: Completed/Met Date Met:  02/09/14 Goal: Pain controlled with appropriate interventions Outcome: Progressing Goal: Dangle or out of bed evening of surgery Outcome: Progressing Goal: Initial discharge plan identified Outcome: Completed/Met Date Met:  02/09/14 Goal: Hemodynamically stable Outcome: Completed/Met Date Met:  02/09/14

## 2014-02-10 ENCOUNTER — Encounter (HOSPITAL_COMMUNITY): Payer: Self-pay | Admitting: Orthopedic Surgery

## 2014-02-10 LAB — BASIC METABOLIC PANEL
Anion gap: 8 (ref 5–15)
BUN: 10 mg/dL (ref 6–23)
CALCIUM: 8.8 mg/dL (ref 8.4–10.5)
CO2: 26 meq/L (ref 19–32)
Chloride: 104 mEq/L (ref 96–112)
Creatinine, Ser: 0.61 mg/dL (ref 0.50–1.10)
GFR calc Af Amer: >90 mL/min (ref >90)
GFR calc non Af Amer: >90 mL/min (ref >90)
GLUCOSE: 146 mg/dL — AB (ref 70–99)
Potassium: 4.9 mEq/L (ref 3.7–5.3)
Sodium: 138 mEq/L (ref 137–147)

## 2014-02-10 LAB — CBC
HCT: 32.9 % — ABNORMAL LOW (ref 36.0–46.0)
HEMOGLOBIN: 10.7 g/dL — AB (ref 12.0–15.0)
MCH: 28.8 pg (ref 26.0–34.0)
MCHC: 32.5 g/dL (ref 30.0–36.0)
MCV: 88.7 fL (ref 78.0–100.0)
PLATELETS: 239 10*3/uL (ref 150–400)
RBC: 3.71 MIL/uL — ABNORMAL LOW (ref 3.87–5.11)
RDW: 12.7 % (ref 11.5–15.5)
WBC: 12.5 10*3/uL — ABNORMAL HIGH (ref 4.0–10.5)

## 2014-02-10 MED ORDER — OMEPRAZOLE 20 MG PO CPDR
40.0000 mg | DELAYED_RELEASE_CAPSULE | Freq: Every day | ORAL | Status: DC
  Administered 2014-02-10: 40 mg via ORAL
  Filled 2014-02-10 (×2): qty 2

## 2014-02-10 MED ORDER — NON FORMULARY: 40.0000 mg | Freq: Every day | Status: DC

## 2014-02-10 NOTE — Progress Notes (Signed)
   Subjective: 1 Day Post-Op Procedure(s) (LRB): RIGHT TOTAL KNEE ARTHROPLASTY (Right) Patient reports pain as mild.   Patient seen in rounds by Dr. Wynelle Link. Patient is well, but has had some minor complaints of pain in the knee, requiring pain medications We will start therapy today.  Plan is to go Home after hospital stay.  Objective: Vital signs in last 24 hours: Temp:  [97.3 F (36.3 C)-98.6 F (37 C)] 98.5 F (36.9 C) (12/10 0646) Pulse Rate:  [69-88] 75 (12/10 0646) Resp:  [9-16] 14 (12/10 0646) BP: (96-135)/(46-71) 99/46 mmHg (12/10 0646) SpO2:  [93 %-100 %] 95 % (12/10 0646) Weight:  [73.029 kg (161 lb)] 73.029 kg (161 lb) (12/09 1205)  Intake/Output from previous day:  Intake/Output Summary (Last 24 hours) at 02/10/14 0724 Last data filed at 02/10/14 0650  Gross per 24 hour  Intake 3346.25 ml  Output   3200 ml  Net 146.25 ml    Labs:  Recent Labs  02/10/14 0459  HGB 10.7*    Recent Labs  02/10/14 0459  WBC 12.5*  RBC 3.71*  HCT 32.9*  PLT 239    Recent Labs  02/10/14 0459  NA 138  K 4.9  CL 104  CO2 26  BUN 10  CREATININE 0.61  GLUCOSE 146*  CALCIUM 8.8   No results for input(s): LABPT, INR in the last 72 hours.  EXAM General - Patient is Alert, Appropriate and Oriented Extremity - Neurovascular intact Sensation intact distally Dorsiflexion/Plantar flexion intact Dressing - dressing C/D/I Motor Function - intact, moving foot and toes well on exam.  Hemovac pulled without difficulty.  Past Medical History  Diagnosis Date  . Muscle pain   . Bilateral swelling of feet   . Thyroid disease   . Fatigue   . Hypothyroidism   . Anxiety   . Arthritis   . GERD (gastroesophageal reflux disease)   . Headache(784.0)   . Acute neck pain     bulging disk C4 through T1  . Heart murmur     HX OF  . Fibromyalgia   . Complication of anesthesia   . PONV (postoperative nausea and vomiting)   . Cancer 04-2013    small amt cancer in thyroid      Thyroidectomy  04/2013    Assessment/Plan: 1 Day Post-Op Procedure(s) (LRB): RIGHT TOTAL KNEE ARTHROPLASTY (Right) Principal Problem:   OA (osteoarthritis) of knee  Estimated body mass index is 27.62 kg/(m^2) as calculated from the following:   Height as of this encounter: 5\' 4"  (1.626 m).   Weight as of this encounter: 73.029 kg (161 lb). Advance diet Up with therapy Plan for discharge tomorrow Discharge home with home health  DVT Prophylaxis - Xarelto Weight-Bearing as tolerated to right leg D/C O2 and Pulse OX and try on Room Air  Arlee Muslim, PA-C Orthopaedic Surgery 02/10/2014, 7:24 AM

## 2014-02-10 NOTE — Evaluation (Signed)
Occupational Therapy Evaluation Patient Details Name: Michele Glenn MRN: [id number] DOB: 17-Mar-1955 Today's Date: 02/10/2014    History of Present Illness Pt is s/p R TKA   Clinical Impression   This 58 year old female was admitted for the above surgery. She has a h/o L TKA.  Reviewed all education for bathroom transfers and safety.  Pt does not need any further OT at this time.    Follow Up Recommendations    No further OT   Equipment Recommendations    None recommended by OT   Recommendations for Other Services       Precautions / Restrictions Precautions Precautions: Knee Precaution Comments: pt is performing SLR and did not use KI when up earlier Restrictions Weight Bearing Restrictions: No      Mobility Bed Mobility Overal bed mobility: Modified Independent             General bed mobility comments: HOB raised 20  Transfers Overall transfer level: Needs assistance Equipment used: Rolling [place] (2 wheeled) Transfers: Sit to/from Stand Sit to Stand: Supervision         General transfer comment: cues for hand placement    Balance                                            ADL Overall ADL's : Needs assistance/impaired                         Toilet Transfer: Supervision/safety;Ambulation;Comfort height toilet       Tub/ Shower Transfer: Supervision/safety;Walk-in shower;Ambulation     General ADL Comments: pt had already completed ADL and she can perform this at supervision level.  She has a h/o other TKA and has all DME and assist at home.  Reviewed hand placement for sit to stand (not to push up with both hands on [place]).  Pt tends to move quickly but was not unsafe     Vision                     Perception     Praxis      Pertinent Vitals/Pain Pain Assessment: 0-10 Pain Score: 2  Pain Location: R thigh Pain Descriptors / Indicators: Sore Pain Intervention(s): Limited activity within patient's  tolerance;Monitored during session;Premedicated before session;Patient requesting pain meds-RN notified     Hand Dominance     Extremity/Trunk Assessment Upper Extremity Assessment Upper Extremity Assessment: Overall WFL for tasks assessed           Communication Communication Communication: No difficulties   Cognition Arousal/Alertness: Awake/alert Behavior During Therapy: WFL for tasks assessed/performed Overall Cognitive Status: Within Functional Limits for tasks assessed                     General Comments       Exercises       Shoulder Instructions      Home Living Family/patient expects to be discharged to:: Private residence Living Arrangements: Spouse/significant other Available Help at Discharge: Family               Bathroom Shower/Tub: Walk-in Psychologist, prison and probation services: Standard     Home Equipment: Environmental consultant - 2 wheels;Bedside commode;Cane - single point          Prior Functioning/Environment Level of Independence: Independent  OT Diagnosis: Acute pain   OT Problem List:     OT Treatment/Interventions:      OT Goals(Current goals can be found in the care plan section) Acute Rehab OT Goals Patient Stated Goal: get back to yard work and take mother on a trip  OT Frequency:     Barriers to D/C:            Co-evaluation              End of Session CPM Right Knee CPM Right Knee: Off  Activity Tolerance: Patient tolerated treatment well Patient left: in bed;with call bell/phone within reach;with family/visitor present   Time: 0852-0902 OT Time Calculation (min): 10 min Charges:  OT General Charges $OT Visit: 1 Procedure OT Evaluation $Initial OT Evaluation Tier I: 1 Procedure G-Codes:    Michele Glenn 03/09/2014, 9:11 AM  Michele Glenn, OTR/L [phone] 03/09/14

## 2014-02-10 NOTE — Evaluation (Signed)
Physical Therapy Evaluation Patient Details Name: Michele Glenn MRN: [id number] DOB: September 02, 1955 Today's Date: 02/10/2014   History of Present Illness  Pt is s/p R TKA  Clinical Impression  Pt will benefit from PT to address deficits below; Doing very well this am, pain up after PT,  RN notified and gave IV meds, ice to right knee as well; Pt very pleasant and motivated    Follow Up Recommendations Home health PT    Equipment Recommendations  None recommended by PT    Recommendations for Other Services       Precautions / Restrictions Precautions Precautions: Knee Precaution Comments: pt is performing SLR and did not use KI when up earlier Restrictions Weight Bearing Restrictions: No Other Position/Activity Restrictions: WBAT      Mobility  Bed Mobility Overal bed mobility: Modified Independent             General bed mobility comments: HOB raised 30*  Transfers Overall transfer level: Needs assistance Equipment used: Rolling [place] (2 wheeled) Transfers: Sit to/from Stand Sit to Stand: Supervision         General transfer comment: cues for hand placement for stand to sit  Ambulation/Gait Ambulation/Gait assistance: Supervision Ambulation Distance (Feet): 120 Feet Assistive device: Rolling [place] (2 wheeled) Gait Pattern/deviations: Step-through pattern     General Gait Details: cues for RW position with turns  Stairs            Wheelchair Mobility    Modified Rankin (Stroke Patients Only)       Balance                                             Pertinent Vitals/Pain Pain Assessment: 0-10 Pain Score: 5  Pain Location: right knee Pain Descriptors / Indicators: Discomfort Pain Intervention(s): Limited activity within patient's tolerance;Monitored during session;Ice applied    Home Living Family/patient expects to be discharged to:: Private residence Living Arrangements: Spouse/significant other Available Help at  Discharge: Family Type of Home: House Home Access: Stairs to enter Entrance Stairs-Rails: None Entrance Stairs-Number of Steps: 2 Home Layout: One level Home Equipment: Environmental consultant - 2 wheels;Bedside commode;Cane - single point      Prior Function Level of Independence: Independent               Hand Dominance   Dominant Hand: Right    Extremity/Trunk Assessment   Upper Extremity Assessment: Overall WFL for tasks assessed;Defer to OT evaluation           Lower Extremity Assessment: RLE deficits/detail RLE Deficits / Details: knee extension and hip flexion 3/5; grossly -6 to 90* flexion AAROM       Communication   Communication: No difficulties  Cognition Arousal/Alertness: Awake/alert Behavior During Therapy: WFL for tasks assessed/performed Overall Cognitive Status: Within Functional Limits for tasks assessed                      General Comments      Exercises Total Joint Exercises Ankle Circles/Pumps: AROM;Both;10 reps Quad Sets: Strengthening;Both;10 reps Short Arc Quad: AROM;Right;10 reps Heel Slides: AROM;AAROM;Right;10 reps Straight Leg Raises: AROM;Right;10 reps Goniometric ROM: grossly - 10 to 90* flexion      Assessment/Plan    PT Assessment Patient needs continued PT services  PT Diagnosis Difficulty walking   PT Problem List Decreased strength;Decreased range of motion;Decreased mobility  PT  Treatment Interventions DME instruction;Gait training;Stair training;Functional mobility training;Therapeutic activities;Therapeutic exercise;Patient/family education   PT Goals (Current goals can be found in the Care Plan section) Acute Rehab PT Goals Patient Stated Goal: get back to yard work and take mother on a trip PT Goal Formulation: With patient Time For Goal Achievement: 02/12/14 Potential to Achieve Goals: Good    Frequency 7X/week   Barriers to discharge        Co-evaluation               End of Session Equipment  Utilized During Treatment: Gait belt Activity Tolerance: Patient tolerated treatment well Patient left: with call bell/phone within reach;in bed;with family/visitor present Nurse Communication: Mobility status         Time: 0921-0948 PT Time Calculation (min) (ACUTE ONLY): 27 min   Charges:   PT Evaluation $Initial PT Evaluation Tier I: 1 Procedure PT Treatments $Gait Training: 8-22 mins $Therapeutic Exercise: 8-22 mins   PT G Codes:          Michele Glenn Mar 07, 2014, 9:55 AM

## 2014-02-10 NOTE — Progress Notes (Signed)
   02/10/14 1400  PT Visit Information  Last PT Received On 02/10/14  Assistance Needed +1  History of Present Illness Pt is s/p R TKA   HHPT unavailable thru [facility] at D/C; Pt prefers [facility] with [facility] and wants to wait until she is available; will make sure pt is I with HEP prior to D/C as she likely will not have PT until next week--pt reports she may have [facility] stop by and measure her knee ROM over the weekend.  PT Time Calculation  PT Start Time (ACUTE ONLY) 1352  PT Stop Time (ACUTE ONLY) 1422  PT Time Calculation (min) (ACUTE ONLY) 30 min  Subjective Data  Patient Stated Goal get back to yard work and take mother on a trip  Precautions  Precautions Knee  Precaution Comments pt is performing SLR and did not use KI  Restrictions  Weight Bearing Restrictions No  Other Position/Activity Restrictions WBAT  Pain Assessment  Pain Assessment 0-10  Pain Score 4  Pain Location right knee  Pain Descriptors / Indicators Burning  Pain Intervention(s) Limited activity within patient's tolerance;Monitored during session  Cognition  Arousal/Alertness Awake/alert  Behavior During Therapy WFL for tasks assessed/performed  Overall Cognitive Status Within Functional Limits for tasks assessed  Bed Mobility  Overal bed mobility Modified Independent  Transfers  Overall transfer level Needs assistance  Equipment used Rolling [place] (2 wheeled)  Transfers Sit to/from Stand  Sit to Stand Supervision  General transfer comment cues for hand placement for stand to sit  Ambulation/Gait  Ambulation/Gait assistance Supervision  Ambulation Distance (Feet) 220 Feet  Assistive device Rolling [place] (2 wheeled)  Gait Pattern/deviations Step-through pattern  General Gait Details cues for RW position, safety with turns and over slighlty uneven surface  Total Joint Exercises  Ankle Circles/Pumps AROM;Both;10 reps  Quad Sets Strengthening;Both;10 reps  Heel Slides AROM;AAROM;Right;10 reps  Straight  Leg Raises AROM;Right;10 reps  Goniometric ROM -5 to 90* AAROM in supine  PT - End of Session  Activity Tolerance Patient tolerated treatment well  Patient left with call bell/phone within reach;in bed;with family/visitor present  Nurse Communication Mobility status  PT - Assessment/Plan  PT Plan Current plan remains appropriate  PT Frequency (ACUTE ONLY) 7X/week  Follow Up Recommendations Home health PT  PT equipment None recommended by PT  PT Goal Progression  Progress towards PT goals Progressing toward goals  Acute Rehab PT Goals  PT Goal Formulation With patient  Time For Goal Achievement 02/12/14  Potential to Achieve Goals Good  PT General Charges  $$ ACUTE PT VISIT 1 Procedure  PT Treatments  $Gait Training 8-22 mins  $Therapeutic Exercise 8-22 mins

## 2014-02-10 NOTE — Care Management Note (Addendum)
    Page 1 of 2   02/11/2014     11:58:28 AM CARE MANAGEMENT NOTE 02/11/2014  Patient:  Michele Glenn, Michele Glenn   Account Number:  [id number]  Date Initiated:  02/10/2014  Documentation initiated by:  [facility]  Subjective/Objective Assessment:   adm: RIGHT TOTAL KNEE ARTHROPLASTY (Right)     Action/Plan:   discharge planning   Anticipated DC Date:  02/11/2014   Anticipated DC Plan:  Michele Glenn  CM consult      [facility] Choice  HOME HEALTH   Choice offered to / List presented to:  [name]        [place] arranged  [facility]      [facility] agency  [place]   Status of service:  Completed, signed off Medicare Important Message given?   (If response is "NO", the following Medicare IM given date fields will be blank) Date Medicare IM given:   Medicare IM given by:   Date Additional Medicare IM given:   Additional Medicare IM given by:    Discharge Disposition:  [place]  Per UR Regulation:  Reviewed for med. necessity/level of care/duration of stay  If discussed at [place] of Stay Meetings, dates discussed:    Comments:  02-11-14 Sunday Spillers RN CM 29 Spoke with patient at bedside. Now agreable to [facility] with [facility]. [place] and they can provide PT services with start date of 12/12. Patient notified. No further needs assessed. Patient ready for d/c home.  02/10/14 08:15 CM met with pt who chooses [facility] and [place]: PT) but unfortunately, after referring to [place] rep, [place] cannot accept referral bc of staffing. CM explained this to the pt who states she doesnt want an alternative choice of home health agency and is refusing HH.  CM encouraged her to discuss this with her PT person here and her MD but pt is adamant in her decision; she states she did well with her last knee and feels comfortable going home with self care.  Pt has all the DME from last surgery.  No other CM needs  communicated. Mariane Masters, BSN, CM [phone].

## 2014-02-11 LAB — BASIC METABOLIC PANEL
Anion gap: 9 (ref 5–15)
BUN: 11 mg/dL (ref 6–23)
CHLORIDE: 103 meq/L (ref 96–112)
CO2: 28 meq/L (ref 19–32)
Calcium: 8.7 mg/dL (ref 8.4–10.5)
Creatinine, Ser: 0.61 mg/dL (ref 0.50–1.10)
GFR calc Af Amer: >90 mL/min (ref >90)
GFR calc non Af Amer: >90 mL/min (ref >90)
Glucose, Bld: 104 mg/dL — ABNORMAL HIGH (ref 70–99)
Potassium: 4.2 mEq/L (ref 3.7–5.3)
Sodium: 140 mEq/L (ref 137–147)

## 2014-02-11 LAB — CBC
HEMATOCRIT: 32.5 % — AB (ref 36.0–46.0)
Hemoglobin: 10.7 g/dL — ABNORMAL LOW (ref 12.0–15.0)
MCH: 29.2 pg (ref 26.0–34.0)
MCHC: 32.9 g/dL (ref 30.0–36.0)
MCV: 88.8 fL (ref 78.0–100.0)
PLATELETS: 255 10*3/uL (ref 150–400)
RBC: 3.66 MIL/uL — AB (ref 3.87–5.11)
RDW: 13 % (ref 11.5–15.5)
WBC: 13.1 10*3/uL — AB (ref 4.0–10.5)

## 2014-02-11 MED ORDER — TRAMADOL HCL 50 MG PO TABS: 50.0000 mg | ORAL_TABLET | Freq: Four times a day (QID) | ORAL | Status: AC | PRN

## 2014-02-11 MED ORDER — OXYCODONE HCL 5 MG PO TABS: 5.0000 mg | ORAL_TABLET | ORAL | Status: DC | PRN

## 2014-02-11 MED ORDER — METHOCARBAMOL 500 MG PO TABS: 500.0000 mg | ORAL_TABLET | Freq: Four times a day (QID) | ORAL | Status: DC | PRN

## 2014-02-11 MED ORDER — RIVAROXABAN 10 MG PO TABS: 10.0000 mg | ORAL_TABLET | Freq: Every day | ORAL | Status: DC

## 2014-02-11 NOTE — Progress Notes (Signed)
Physical Therapy Treatment Patient Details Name: Michele Glenn MRN: [id number] DOB: 1956-01-21 Today's Date: 02/11/2014    History of Present Illness Pt is s/p R TKA    PT Comments    Pt doing well, still working on post acute plan for PT; Have advised pt on frequency of HEP and she is I with it at this time;  Follow Up Recommendations  Home health PT;Outpatient PT (vs)     Equipment Recommendations  None recommended by PT    Recommendations for Other Services       Precautions / Restrictions Precautions Precautions: Knee Precaution Comments: pt is performing SLR and did not use KI Restrictions Weight Bearing Restrictions: No Other Position/Activity Restrictions: WBAT    Mobility  Bed Mobility Overal bed mobility: Modified Independent                Transfers Overall transfer level: Needs assistance Equipment used: Rolling [place] (2 wheeled) Transfers: Sit to/from Stand Sit to Stand: Supervision         General transfer comment: subtle cues for hand placement, pt self corrects  Ambulation/Gait Ambulation/Gait assistance: Supervision;Modified independent (Device/Increase time) Ambulation Distance (Feet): 260 Feet Assistive device: Rolling [place] (2 wheeled) Gait Pattern/deviations: Step-through pattern;Antalgic Gait velocity: pt self corrects for antalgic "limp" that she does state is a habit       Financial trader Rankin (Stroke Patients Only)       Balance                                    Cognition Arousal/Alertness: Awake/alert Behavior During Therapy: WFL for tasks assessed/performed Overall Cognitive Status: Within Functional Limits for tasks assessed                      Exercises Total Joint Exercises Ankle Circles/Pumps: AROM;Both;10 reps Quad Sets: Strengthening;Both;10 reps Short Arc Quad: AROM;Right;10 reps Heel Slides: AROM;AAROM;Right;10 reps Straight Leg  Raises: AROM;Right;10 reps Goniometric ROM: -5 to 94* flexion    General Comments        Pertinent Vitals/Pain Pain Assessment: 0-10 Pain Score: 5  Pain Location: right knee Pain Descriptors / Indicators: Aching;Tightness Pain Intervention(s): Monitored during session;Limited activity within patient's tolerance;Repositioned (pt declined ice)    Home Living                      Prior Function            PT Goals (current goals can now be found in the care plan section) Acute Rehab PT Goals Patient Stated Goal: get back to yard work and take mother on a trip Time For Goal Achievement: 02/12/14 Potential to Achieve Goals: Good Progress towards PT goals: Progressing toward goals    Frequency  7X/week    PT Plan Current plan remains appropriate    Co-evaluation             End of Session   Activity Tolerance: Patient tolerated treatment well Patient left: with call bell/phone within reach;in bed;with family/visitor present     Time: 1572-6203 PT Time Calculation (min) (ACUTE ONLY): 29 min  Charges:  $Gait Training: 8-22 mins $Therapeutic Exercise: 8-22 mins                    G Codes:  [facility] 02/11/2014, 11:05 AM

## 2014-02-11 NOTE — Progress Notes (Signed)
Subjective: 2 Days Post-Op Procedure(s) (LRB): RIGHT TOTAL KNEE ARTHROPLASTY (Right) Patient reports pain as mild.   Patient seen in rounds with Dr. Wynelle Link.  She is doing well.  Performing SLR's and walking well with therapy on day one. Patient is well, but has had some minor complaints of pain in the knee, requiring pain medications Patient is ready to go home. Patient was told [facility] not available due to staffing issues.  Her neighbor works with [facility] so she wanted to use this agency.  Will see if another agency, possible [facility], is available.  If not, then will provide a RX for outpatient therapy and patient may start next week.  Objective: Vital signs in last 24 hours: Temp:  [98.1 F (36.7 C)-98.6 F (37 C)] 98.1 F (36.7 C) (12/11 0544) Pulse Rate:  [60-75] 60 (12/11 0544) Resp:  [16] 16 (12/11 0544) BP: (109-128)/(51-62) 128/61 mmHg (12/11 0544) SpO2:  [96 %-100 %] 96 % (12/11 0544)  Intake/Output from previous day:  Intake/Output Summary (Last 24 hours) at 02/11/14 0748 Last data filed at 02/11/14 0500  Gross per 24 hour  Intake    940 ml  Output    700 ml  Net    240 ml    Labs:  Recent Labs  02/10/14 0459 02/11/14 0450  HGB 10.7* 10.7*    Recent Labs  02/10/14 0459 02/11/14 0450  WBC 12.5* 13.1*  RBC 3.71* 3.66*  HCT 32.9* 32.5*  PLT 239 255    Recent Labs  02/10/14 0459 02/11/14 0450  NA 138 140  K 4.9 4.2  CL 104 103  CO2 26 28  BUN 10 11  CREATININE 0.61 0.61  GLUCOSE 146* 104*  CALCIUM 8.8 8.7   No results for input(s): LABPT, INR in the last 72 hours.  EXAM: General - Patient is Alert, Appropriate and Oriented Extremity - Neurovascular intact Sensation intact distally Dorsiflexion/Plantar flexion intact Incision - clean, dry, no drainage, healing Motor Function - intact, moving foot and toes well on exam.   Assessment/Plan: 2 Days Post-Op Procedure(s) (LRB): RIGHT TOTAL KNEE ARTHROPLASTY  (Right) Procedure(s) (LRB): RIGHT TOTAL KNEE ARTHROPLASTY (Right) Past Medical History  Diagnosis Date  . Muscle pain   . Bilateral swelling of feet   . Thyroid disease   . Fatigue   . Hypothyroidism   . Anxiety   . Arthritis   . GERD (gastroesophageal reflux disease)   . Headache(784.0)   . Acute neck pain     bulging disk C4 through T1  . Heart murmur     HX OF  . Fibromyalgia   . Complication of anesthesia   . PONV (postoperative nausea and vomiting)   . Cancer 04-2013    small amt cancer in thyroid     Thyroidectomy  04/2013   Principal Problem:   OA (osteoarthritis) of knee  Estimated body mass index is 27.62 kg/(m^2) as calculated from the following:   Height as of this encounter: 5\' 4"  (1.626 m).   Weight as of this encounter: 73.029 kg (161 lb). Up with therapy Discharge home with home health if available. If not, then will start outpatient next week. Diet - Regular diet Follow up - in 2 weeks with Dian Situ or [employer] in the office the week December 21st thru 23rd. Activity - WBAT Disposition - Home Condition Upon Discharge - Good D/C Meds - See DC Summary DVT Prophylaxis - Xarelto  Arlee Muslim, PA-C Orthopaedic Surgery 02/11/2014, 7:48 AM

## 2014-02-11 NOTE — Discharge Summary (Signed)
Physician Discharge Summary   Patient ID: Michele Glenn MRN: [id number] DOB/AGE: 10-24-1955 58 y.o.  Admit date: 02/09/2014 Discharge date: 02-11-2014  Primary Diagnosis:  Osteoarthritis Right knee(s)  Admission Diagnoses:  Past Medical History  Diagnosis Date  . Muscle pain   . Bilateral swelling of feet   . Thyroid disease   . Fatigue   . Hypothyroidism   . Anxiety   . Arthritis   . GERD (gastroesophageal reflux disease)   . Headache(784.0)   . Acute neck pain     bulging disk C4 through T1  . Heart murmur     HX OF  . Fibromyalgia   . Complication of anesthesia   . PONV (postoperative nausea and vomiting)   . Cancer 04-2013    small amt cancer in thyroid     Thyroidectomy  04/2013   Discharge Diagnoses:   Principal Problem:   OA (osteoarthritis) of knee  Estimated body mass index is 27.62 kg/(m^2) as calculated from the following:   Height as of this encounter: 5' 4" (1.626 m).   Weight as of this encounter: 73.029 kg (161 lb).  Procedure:  Procedure(s) (LRB): RIGHT TOTAL KNEE ARTHROPLASTY (Right)   Consults: None  HPI: Michele Glenn is a 58 y.o. year old female with end stage OA of her right knee with progressively worsening pain and dysfunction. She has constant pain, with activity and at rest and significant functional deficits with difficulties even with ADLs. She has had extensive non-op management including analgesics, injections of cortisone and viscosupplements, and home exercise program, but remains in significant pain with significant dysfunction.Radiographs show bone on bone arthritis patellofemoral. She presents now for right Total Knee Arthroplasty.   Laboratory Data: Admission on 02/09/2014  Component Date Value Ref Range Status  . ABO/RH(D) 02/09/2014 A NEG   Final  . Antibody Screen 02/09/2014 NEG   Final  . Sample Expiration 02/09/2014 02/12/2014   Final  . WBC 02/10/2014 12.5* 4.0 - 10.5 K/uL Final  . RBC 02/10/2014 3.71* 3.87 - 5.11 MIL/uL  Final  . Hemoglobin 02/10/2014 10.7* 12.0 - 15.0 g/dL Final  . HCT 02/10/2014 32.9* 36.0 - 46.0 % Final  . MCV 02/10/2014 88.7  78.0 - 100.0 fL Final  . MCH 02/10/2014 28.8  26.0 - 34.0 pg Final  . MCHC 02/10/2014 32.5  30.0 - 36.0 g/dL Final  . RDW 02/10/2014 12.7  11.5 - 15.5 % Final  . Platelets 02/10/2014 239  150 - 400 K/uL Final  . Sodium 02/10/2014 138  137 - 147 mEq/L Final  . Potassium 02/10/2014 4.9  3.7 - 5.3 mEq/L Final  . Chloride 02/10/2014 104  96 - 112 mEq/L Final  . CO2 02/10/2014 26  19 - 32 mEq/L Final  . Glucose, Bld 02/10/2014 146* 70 - 99 mg/dL Final  . BUN 02/10/2014 10  6 - 23 mg/dL Final  . Creatinine, Ser 02/10/2014 0.61  0.50 - 1.10 mg/dL Final  . Calcium 02/10/2014 8.8  8.4 - 10.5 mg/dL Final  . GFR calc non Af Amer 02/10/2014 >90  >90 mL/min Final  . GFR calc Af Amer 02/10/2014 >90  >90 mL/min Final   Comment: (NOTE) The eGFR has been calculated using the CKD EPI equation. This calculation has not been validated in all clinical situations. eGFR's persistently <90 mL/min signify possible Chronic Kidney Disease.   . Anion gap 02/10/2014 8  5 - 15 Final  . WBC 02/11/2014 13.1* 4.0 - 10.5 K/uL Final  . RBC  02/11/2014 3.66* 3.87 - 5.11 MIL/uL Final  . Hemoglobin 02/11/2014 10.7* 12.0 - 15.0 g/dL Final  . HCT 02/11/2014 32.5* 36.0 - 46.0 % Final  . MCV 02/11/2014 88.8  78.0 - 100.0 fL Final  . MCH 02/11/2014 29.2  26.0 - 34.0 pg Final  . MCHC 02/11/2014 32.9  30.0 - 36.0 g/dL Final  . RDW 02/11/2014 13.0  11.5 - 15.5 % Final  . Platelets 02/11/2014 255  150 - 400 K/uL Final  . Sodium 02/11/2014 140  137 - 147 mEq/L Final  . Potassium 02/11/2014 4.2  3.7 - 5.3 mEq/L Final  . Chloride 02/11/2014 103  96 - 112 mEq/L Final  . CO2 02/11/2014 28  19 - 32 mEq/L Final  . Glucose, Bld 02/11/2014 104* 70 - 99 mg/dL Final  . BUN 02/11/2014 11  6 - 23 mg/dL Final  . Creatinine, Ser 02/11/2014 0.61  0.50 - 1.10 mg/dL Final  . Calcium 02/11/2014 8.7  8.4 - 10.5 mg/dL  Final  . GFR calc non Af Amer 02/11/2014 >90  >90 mL/min Final  . GFR calc Af Amer 02/11/2014 >90  >90 mL/min Final   Comment: (NOTE) The eGFR has been calculated using the CKD EPI equation. This calculation has not been validated in all clinical situations. eGFR's persistently <90 mL/min signify possible Chronic Kidney Disease.   Georgiann Hahn gap 02/11/2014 9  5 - 15 Final  Hospital Outpatient Visit on 02/02/2014  Component Date Value Ref Range Status  . aPTT 02/02/2014 31  24 - 37 seconds Final  . WBC 02/02/2014 7.7  4.0 - 10.5 K/uL Final  . RBC 02/02/2014 4.75  3.87 - 5.11 MIL/uL Final  . Hemoglobin 02/02/2014 13.9  12.0 - 15.0 g/dL Final  . HCT 02/02/2014 42.1  36.0 - 46.0 % Final  . MCV 02/02/2014 88.6  78.0 - 100.0 fL Final  . MCH 02/02/2014 29.3  26.0 - 34.0 pg Final  . MCHC 02/02/2014 33.0  30.0 - 36.0 g/dL Final  . RDW 02/02/2014 12.7  11.5 - 15.5 % Final  . Platelets 02/02/2014 264  150 - 400 K/uL Final  . Sodium 02/02/2014 137  137 - 147 mEq/L Final  . Potassium 02/02/2014 4.4  3.7 - 5.3 mEq/L Final  . Chloride 02/02/2014 99  96 - 112 mEq/L Final  . CO2 02/02/2014 24  19 - 32 mEq/L Final  . Glucose, Bld 02/02/2014 94  70 - 99 mg/dL Final  . BUN 02/02/2014 16  6 - 23 mg/dL Final  . Creatinine, Ser 02/02/2014 0.61  0.50 - 1.10 mg/dL Final  . Calcium 02/02/2014 9.5  8.4 - 10.5 mg/dL Final  . Total Protein 02/02/2014 7.7  6.0 - 8.3 g/dL Final  . Albumin 02/02/2014 3.9  3.5 - 5.2 g/dL Final  . AST 02/02/2014 20  0 - 37 U/L Final  . ALT 02/02/2014 17  0 - 35 U/L Final  . Alkaline Phosphatase 02/02/2014 106  39 - 117 U/L Final  . Total Bilirubin 02/02/2014 0.4  0.3 - 1.2 mg/dL Final  . GFR calc non Af Amer 02/02/2014 >90  >90 mL/min Final  . GFR calc Af Amer 02/02/2014 >90  >90 mL/min Final   Comment: (NOTE) The eGFR has been calculated using the CKD EPI equation. This calculation has not been validated in all clinical situations. eGFR's persistently <90 mL/min signify  possible Chronic Kidney Disease.   . Anion gap 02/02/2014 14  5 - 15 Final  . Prothrombin Time 02/02/2014  13.3  11.6 - 15.2 seconds Final  . INR 02/02/2014 1.00  0.00 - 1.49 Final  . Color, Urine 02/02/2014 YELLOW  YELLOW Final  . APPearance 02/02/2014 CLEAR  CLEAR Final  . Specific Gravity, Urine 02/02/2014 1.021  1.005 - 1.030 Final  . pH 02/02/2014 5.5  5.0 - 8.0 Final  . Glucose, UA 02/02/2014 NEGATIVE  NEGATIVE mg/dL Final  . Hgb urine dipstick 02/02/2014 NEGATIVE  NEGATIVE Final  . Bilirubin Urine 02/02/2014 NEGATIVE  NEGATIVE Final  . Ketones, ur 02/02/2014 NEGATIVE  NEGATIVE mg/dL Final  . Protein, ur 02/02/2014 NEGATIVE  NEGATIVE mg/dL Final  . Urobilinogen, UA 02/02/2014 0.2  0.0 - 1.0 mg/dL Final  . Nitrite 02/02/2014 NEGATIVE  NEGATIVE Final  . Leukocytes, UA 02/02/2014 NEGATIVE  NEGATIVE Final   MICROSCOPIC NOT DONE ON URINES WITH NEGATIVE PROTEIN, BLOOD, LEUKOCYTES, NITRITE, OR GLUCOSE <1000 mg/dL.  [name] Kitchen MRSA, PCR 02/02/2014 NEGATIVE  NEGATIVE Final  . Staphylococcus aureus 02/02/2014 NEGATIVE  NEGATIVE Final   Comment:        The Xpert SA Assay (FDA approved for NASAL specimens in patients over 50 years of age), is one component of a comprehensive surveillance program.  Test performance has been validated by [employer] for patients greater than or equal to 36 year old. It is not intended to diagnose infection nor to guide or monitor treatment.      X-Rays:No results found.  EKG:No orders found for this or any previous visit.   Hospital Course: Michele Glenn is a 58 y.o. who was admitted to [facility]. They were brought to the operating room on 02/09/2014 and underwent Procedure(s): RIGHT TOTAL KNEE ARTHROPLASTY.  Patient tolerated the procedure well and was later transferred to the recovery room and then to the orthopaedic floor for postoperative care.  They were given PO and IV analgesics for pain control following their surgery.  They were given 24  hours of postoperative antibiotics of  Anti-infectives    Start     Dose/Rate Route Frequency Ordered Stop   02/09/14 1400  ceFAZolin (ANCEF) IVPB 2 g/50 mL premix     2 g100 mL/hr over 30 Minutes Intravenous Every 6 hours 02/09/14 1211 02/09/14 2119   02/09/14 0554  ceFAZolin (ANCEF) IVPB 2 g/50 mL premix     2 g100 mL/hr over 30 Minutes Intravenous On call to O.R. 02/09/14 0998 02/09/14 0846     and started on DVT prophylaxis in the form of Xarelto.   PT and OT were ordered for total joint protocol.  Discharge planning consulted to help with postop disposition and equipment needs.  Patient had a decent night on the evening of surgery but not much sleep.  They started to get up OOB with therapy on day one. Hemovac drain was pulled without difficulty.  Continued to work with therapy into day two.  Dressing was changed on day two and the incision was healing well. Patient was seen in rounds and was ready to go home.  Waiting on arrangements for Home Health. If not available, then will likely start outpatient therapy next week.   Discharge home with home health if available. If not, then will start outpatient next week. Diet - Regular diet Follow up - in 2 weeks with Dian Situ or [employer] in the office the week December 21st thru 23rd. Activity - WBAT Disposition - Home Condition Upon Discharge - Good D/C Meds - See DC Summary DVT Prophylaxis - Xarelto      Discharge Instructions  Call MD / Call 911    Complete by:  As directed   If you experience chest pain or shortness of breath, CALL 911 and be transported to the hospital emergency room.  If you develope a fever above 101 F, pus (white drainage) or increased drainage or redness at the wound, or calf pain, call your surgeon's office.     Change dressing    Complete by:  As directed   Change dressing daily with sterile 4 x 4 inch gauze dressing and apply TED hose. Do not submerge the incision under water.     Constipation Prevention    Complete  by:  As directed   Drink plenty of fluids.  Prune juice may be helpful.  You may use a stool softener, such as Colace (over the counter) 100 mg twice a day.  Use MiraLax (over the counter) for constipation as needed.     Diet general    Complete by:  As directed      Discharge instructions    Complete by:  As directed   Pick up stool softner and laxative for home. Do not submerge incision under water. May shower three days following surgery. Continue to use ice for pain and swelling from surgery.  Take Xarelto for two and a half more weeks, then discontinue Xarelto. Once the patient has completed the Xarelto, they may resume the 81 mg Aspirin.  Postoperative Constipation Protocol  Constipation - defined medically as fewer than three stools per week and severe constipation as less than one stool per week.  One of the most common issues patients have following surgery is constipation.  Even if you have a regular bowel pattern at home, your normal regimen is likely to be disrupted due to multiple reasons following surgery.  Combination of anesthesia, postoperative narcotics, change in appetite and fluid intake all can affect your bowels.  In order to avoid complications following surgery, here are some recommendations in order to help you during your recovery period.  Colace (docusate) - Pick up an over-the-counter form of Colace or another stool softener and take twice a day as long as you are requiring postoperative pain medications.  Take with a full glass of water daily.  If you experience loose stools or diarrhea, hold the colace until you stool forms back up.  If your symptoms do not get better within 1 week or if they get worse, check with your doctor.  Dulcolax (bisacodyl) - Pick up over-the-counter and take as directed by the product packaging as needed to assist with the movement of your bowels.  Take with a full glass of water.  Use this product as needed if not relieved by Colace only.    MiraLax (polyethylene glycol) - Pick up over-the-counter to have on hand.  MiraLax is a solution that will increase the amount of water in your bowels to assist with bowel movements.  Take as directed and can mix with a glass of water, juice, soda, coffee, or tea.  Take if you go more than two days without a movement. Do not use MiraLax more than once per day. Call your doctor if you are still constipated or irregular after using this medication for 7 days in a row.  If you continue to have problems with postoperative constipation, please contact the office for further assistance and recommendations.  If you experience "the worst abdominal pain ever" or develop nausea or vomiting, please contact the office immediatly for further recommendations for treatment.  Do not put a pillow under the knee. Place it under the heel.    Complete by:  As directed      Do not sit on low chairs, stoools or toilet seats, as it may be difficult to get up from low surfaces    Complete by:  As directed      Driving restrictions    Complete by:  As directed   No driving until released by the physician.     Increase activity slowly as tolerated    Complete by:  As directed      Lifting restrictions    Complete by:  As directed   No lifting until released by the physician.     Patient may shower    Complete by:  As directed   You may shower without a dressing once there is no drainage.  Do not wash over the wound.  If drainage remains, do not shower until drainage stops.     TED hose    Complete by:  As directed   Use stockings (TED hose) for 3 weeks on both leg(s).  You may remove them at night for sleeping.     Weight bearing as tolerated    Complete by:  As directed             Medication List    STOP taking these medications        aspirin EC 81 MG tablet     HYDROcodone-acetaminophen 5-325 MG per tablet  Commonly known as:  NORCO/VICODIN     multivitamin with minerals Tabs tablet      vitamin C 500 MG tablet  Commonly known as:  ASCORBIC ACID      TAKE these medications        acetaminophen 500 MG tablet  Commonly known as:  TYLENOL  Take 500 mg by mouth every 6 (six) hours as needed (Pain).     levothyroxine 100 MCG tablet  Commonly known as:  SYNTHROID, LEVOTHROID  Take 100 mcg by mouth daily before breakfast.     methocarbamol 500 MG tablet  Commonly known as:  ROBAXIN  Take 1 tablet (500 mg total) by mouth every 6 (six) hours as needed for muscle spasms.     omeprazole 40 MG capsule  Commonly known as:  PRILOSEC  Take 40 mg by mouth at bedtime.     oxyCODONE 5 MG immediate release tablet  Commonly known as:  Oxy IR/ROXICODONE  Take 1-2 tablets (5-10 mg total) by mouth every 3 (three) hours as needed for moderate pain, severe pain or breakthrough pain.     rivaroxaban 10 MG Tabs tablet  Commonly known as:  XARELTO  - Take 1 tablet (10 mg total) by mouth daily with breakfast. Take Xarelto for two and a half more weeks, then discontinue Xarelto.  - Once the patient has completed the Xarelto, they may resume the 81 mg Aspirin.     sertraline 50 MG tablet  Commonly known as:  ZOLOFT  Take 50 mg by mouth at bedtime.     traMADol 50 MG tablet  Commonly known as:  ULTRAM  Take 1-2 tablets (50-100 mg total) by mouth every 6 (six) hours as needed (mild pain).       Follow-up Information    Follow up with Gearlean Alf, MD. Schedule an appointment as soon as possible for a visit in 2 weeks.   Specialty:  Orthopedic Surgery   Why:  Call office at [phone] to  setup appointment with Dr. Terressa Koyanagi PAs, Amber or Dian Situ, the week of Dec. 21st thru 23rd.   Contact information:   [address] [place] [address] [phone]       Signed: Arlee Muslim, PA-C Orthopaedic Surgery 02/11/2014, 8:24 AM

## 2014-04-07 ENCOUNTER — Ambulatory Visit: Payer: Self-pay | Admitting: Orthopedic Surgery

## 2015-06-07 DIAGNOSIS — M25561 Pain in right knee: Secondary | ICD-10-CM | POA: Diagnosis not present

## 2015-06-07 DIAGNOSIS — Z96651 Presence of right artificial knee joint: Secondary | ICD-10-CM | POA: Diagnosis not present

## 2015-06-07 DIAGNOSIS — Z471 Aftercare following joint replacement surgery: Secondary | ICD-10-CM | POA: Diagnosis not present

## 2015-06-14 DIAGNOSIS — Z96651 Presence of right artificial knee joint: Secondary | ICD-10-CM | POA: Diagnosis not present

## 2015-06-19 DIAGNOSIS — Z96651 Presence of right artificial knee joint: Secondary | ICD-10-CM | POA: Diagnosis not present

## 2015-06-21 DIAGNOSIS — Z96651 Presence of right artificial knee joint: Secondary | ICD-10-CM | POA: Diagnosis not present

## 2015-06-23 DIAGNOSIS — Z96651 Presence of right artificial knee joint: Secondary | ICD-10-CM | POA: Diagnosis not present

## 2015-06-26 DIAGNOSIS — Z96651 Presence of right artificial knee joint: Secondary | ICD-10-CM | POA: Diagnosis not present

## 2015-06-27 DIAGNOSIS — Z96651 Presence of right artificial knee joint: Secondary | ICD-10-CM | POA: Diagnosis not present

## 2015-06-29 DIAGNOSIS — Z96651 Presence of right artificial knee joint: Secondary | ICD-10-CM | POA: Diagnosis not present

## 2015-07-03 DIAGNOSIS — Z96651 Presence of right artificial knee joint: Secondary | ICD-10-CM | POA: Diagnosis not present

## 2015-07-04 DIAGNOSIS — Z96651 Presence of right artificial knee joint: Secondary | ICD-10-CM | POA: Diagnosis not present

## 2015-07-06 DIAGNOSIS — Z96651 Presence of right artificial knee joint: Secondary | ICD-10-CM | POA: Diagnosis not present

## 2015-07-10 DIAGNOSIS — Z96651 Presence of right artificial knee joint: Secondary | ICD-10-CM | POA: Diagnosis not present

## 2015-07-11 DIAGNOSIS — Z471 Aftercare following joint replacement surgery: Secondary | ICD-10-CM | POA: Diagnosis not present

## 2015-07-11 DIAGNOSIS — M25561 Pain in right knee: Secondary | ICD-10-CM | POA: Diagnosis not present

## 2015-07-11 DIAGNOSIS — Z96651 Presence of right artificial knee joint: Secondary | ICD-10-CM | POA: Diagnosis not present

## 2015-09-19 DIAGNOSIS — E041 Nontoxic single thyroid nodule: Secondary | ICD-10-CM | POA: Diagnosis not present

## 2015-09-19 DIAGNOSIS — C73 Malignant neoplasm of thyroid gland: Secondary | ICD-10-CM | POA: Diagnosis not present

## 2015-10-02 DIAGNOSIS — J342 Deviated nasal septum: Secondary | ICD-10-CM | POA: Diagnosis not present

## 2015-10-02 DIAGNOSIS — H43393 Other vitreous opacities, bilateral: Secondary | ICD-10-CM | POA: Diagnosis not present

## 2015-10-02 DIAGNOSIS — Z7689 Persons encountering health services in other specified circumstances: Secondary | ICD-10-CM | POA: Diagnosis not present

## 2015-10-02 DIAGNOSIS — Z85850 Personal history of malignant neoplasm of thyroid: Secondary | ICD-10-CM | POA: Diagnosis not present

## 2015-10-02 DIAGNOSIS — E890 Postprocedural hypothyroidism: Secondary | ICD-10-CM | POA: Diagnosis not present

## 2015-12-21 DIAGNOSIS — H2513 Age-related nuclear cataract, bilateral: Secondary | ICD-10-CM | POA: Diagnosis not present

## 2015-12-21 DIAGNOSIS — H04123 Dry eye syndrome of bilateral lacrimal glands: Secondary | ICD-10-CM | POA: Diagnosis not present

## 2016-01-03 DIAGNOSIS — M26609 Unspecified temporomandibular joint disorder, unspecified side: Secondary | ICD-10-CM | POA: Diagnosis not present

## 2016-01-03 DIAGNOSIS — R002 Palpitations: Secondary | ICD-10-CM | POA: Diagnosis not present

## 2016-02-01 DIAGNOSIS — M150 Primary generalized (osteo)arthritis: Secondary | ICD-10-CM | POA: Diagnosis not present

## 2016-02-01 DIAGNOSIS — M5030 Other cervical disc degeneration, unspecified cervical region: Secondary | ICD-10-CM | POA: Diagnosis not present

## 2016-03-15 DIAGNOSIS — R300 Dysuria: Secondary | ICD-10-CM | POA: Diagnosis not present

## 2016-03-15 DIAGNOSIS — E890 Postprocedural hypothyroidism: Secondary | ICD-10-CM | POA: Diagnosis not present

## 2016-03-15 DIAGNOSIS — R5383 Other fatigue: Secondary | ICD-10-CM | POA: Diagnosis not present

## 2016-03-15 DIAGNOSIS — Z79899 Other long term (current) drug therapy: Secondary | ICD-10-CM | POA: Diagnosis not present

## 2016-03-15 DIAGNOSIS — K219 Gastro-esophageal reflux disease without esophagitis: Secondary | ICD-10-CM | POA: Diagnosis not present

## 2016-03-15 DIAGNOSIS — Z13220 Encounter for screening for lipoid disorders: Secondary | ICD-10-CM | POA: Diagnosis not present

## 2016-03-15 DIAGNOSIS — Z0001 Encounter for general adult medical examination with abnormal findings: Secondary | ICD-10-CM | POA: Diagnosis not present

## 2016-03-15 DIAGNOSIS — Z6829 Body mass index (BMI) 29.0-29.9, adult: Secondary | ICD-10-CM | POA: Diagnosis not present

## 2016-05-02 DIAGNOSIS — Z1231 Encounter for screening mammogram for malignant neoplasm of breast: Secondary | ICD-10-CM | POA: Diagnosis not present

## 2016-05-14 DIAGNOSIS — R05 Cough: Secondary | ICD-10-CM | POA: Diagnosis not present

## 2016-05-14 DIAGNOSIS — Z20828 Contact with and (suspected) exposure to other viral communicable diseases: Secondary | ICD-10-CM | POA: Diagnosis not present

## 2016-06-13 DIAGNOSIS — J019 Acute sinusitis, unspecified: Secondary | ICD-10-CM | POA: Diagnosis not present

## 2016-08-06 DIAGNOSIS — M26609 Unspecified temporomandibular joint disorder, unspecified side: Secondary | ICD-10-CM | POA: Diagnosis not present

## 2016-08-06 DIAGNOSIS — F419 Anxiety disorder, unspecified: Secondary | ICD-10-CM | POA: Diagnosis not present

## 2016-08-06 DIAGNOSIS — E890 Postprocedural hypothyroidism: Secondary | ICD-10-CM | POA: Diagnosis not present

## 2016-09-27 DIAGNOSIS — E041 Nontoxic single thyroid nodule: Secondary | ICD-10-CM | POA: Diagnosis not present

## 2016-09-27 DIAGNOSIS — Z85850 Personal history of malignant neoplasm of thyroid: Secondary | ICD-10-CM | POA: Diagnosis not present

## 2016-10-14 DIAGNOSIS — J342 Deviated nasal septum: Secondary | ICD-10-CM | POA: Diagnosis not present

## 2016-10-14 DIAGNOSIS — Z85850 Personal history of malignant neoplasm of thyroid: Secondary | ICD-10-CM | POA: Diagnosis not present

## 2016-10-14 DIAGNOSIS — R490 Dysphonia: Secondary | ICD-10-CM | POA: Diagnosis not present

## 2016-11-11 DIAGNOSIS — M79672 Pain in left foot: Secondary | ICD-10-CM | POA: Diagnosis not present

## 2016-11-11 DIAGNOSIS — Z79899 Other long term (current) drug therapy: Secondary | ICD-10-CM | POA: Diagnosis not present

## 2016-11-11 DIAGNOSIS — M19072 Primary osteoarthritis, left ankle and foot: Secondary | ICD-10-CM | POA: Diagnosis not present

## 2016-11-11 DIAGNOSIS — R358 Other polyuria: Secondary | ICD-10-CM | POA: Diagnosis not present

## 2016-11-11 DIAGNOSIS — E890 Postprocedural hypothyroidism: Secondary | ICD-10-CM | POA: Diagnosis not present

## 2016-12-11 DIAGNOSIS — M19072 Primary osteoarthritis, left ankle and foot: Secondary | ICD-10-CM | POA: Diagnosis not present

## 2017-01-08 DIAGNOSIS — J0100 Acute maxillary sinusitis, unspecified: Secondary | ICD-10-CM | POA: Diagnosis not present

## 2017-01-08 DIAGNOSIS — J029 Acute pharyngitis, unspecified: Secondary | ICD-10-CM | POA: Diagnosis not present

## 2017-04-08 DIAGNOSIS — Z1389 Encounter for screening for other disorder: Secondary | ICD-10-CM | POA: Diagnosis not present

## 2017-04-08 DIAGNOSIS — M7062 Trochanteric bursitis, left hip: Secondary | ICD-10-CM | POA: Diagnosis not present

## 2017-04-08 DIAGNOSIS — Z1331 Encounter for screening for depression: Secondary | ICD-10-CM | POA: Diagnosis not present

## 2017-04-08 DIAGNOSIS — R69 Illness, unspecified: Secondary | ICD-10-CM | POA: Diagnosis not present

## 2017-04-08 DIAGNOSIS — Z23 Encounter for immunization: Secondary | ICD-10-CM | POA: Diagnosis not present

## 2017-04-08 DIAGNOSIS — R5383 Other fatigue: Secondary | ICD-10-CM | POA: Diagnosis not present

## 2017-04-08 DIAGNOSIS — E785 Hyperlipidemia, unspecified: Secondary | ICD-10-CM | POA: Diagnosis not present

## 2017-04-08 DIAGNOSIS — E890 Postprocedural hypothyroidism: Secondary | ICD-10-CM | POA: Diagnosis not present

## 2017-04-08 DIAGNOSIS — Z0001 Encounter for general adult medical examination with abnormal findings: Secondary | ICD-10-CM | POA: Diagnosis not present

## 2017-04-14 DIAGNOSIS — M7062 Trochanteric bursitis, left hip: Secondary | ICD-10-CM | POA: Diagnosis not present

## 2017-04-14 DIAGNOSIS — Z20828 Contact with and (suspected) exposure to other viral communicable diseases: Secondary | ICD-10-CM | POA: Diagnosis not present

## 2017-05-21 ENCOUNTER — Encounter: Payer: Self-pay | Admitting: Gastroenterology

## 2017-08-04 DIAGNOSIS — R1031 Right lower quadrant pain: Secondary | ICD-10-CM | POA: Diagnosis not present

## 2017-08-04 DIAGNOSIS — R0982 Postnasal drip: Secondary | ICD-10-CM | POA: Diagnosis not present

## 2017-09-19 DIAGNOSIS — R69 Illness, unspecified: Secondary | ICD-10-CM | POA: Diagnosis not present

## 2017-09-19 DIAGNOSIS — K219 Gastro-esophageal reflux disease without esophagitis: Secondary | ICD-10-CM | POA: Diagnosis not present

## 2017-09-19 DIAGNOSIS — H547 Unspecified visual loss: Secondary | ICD-10-CM | POA: Diagnosis not present

## 2017-09-19 DIAGNOSIS — E039 Hypothyroidism, unspecified: Secondary | ICD-10-CM | POA: Diagnosis not present

## 2017-09-19 DIAGNOSIS — R32 Unspecified urinary incontinence: Secondary | ICD-10-CM | POA: Diagnosis not present

## 2017-09-19 DIAGNOSIS — H269 Unspecified cataract: Secondary | ICD-10-CM | POA: Diagnosis not present

## 2017-09-19 DIAGNOSIS — G8929 Other chronic pain: Secondary | ICD-10-CM | POA: Diagnosis not present

## 2017-09-19 DIAGNOSIS — M792 Neuralgia and neuritis, unspecified: Secondary | ICD-10-CM | POA: Diagnosis not present

## 2017-10-04 DIAGNOSIS — E079 Disorder of thyroid, unspecified: Secondary | ICD-10-CM | POA: Diagnosis not present

## 2017-10-04 DIAGNOSIS — Z85850 Personal history of malignant neoplasm of thyroid: Secondary | ICD-10-CM | POA: Diagnosis not present

## 2017-10-07 DIAGNOSIS — E890 Postprocedural hypothyroidism: Secondary | ICD-10-CM | POA: Diagnosis not present

## 2017-10-14 DIAGNOSIS — Z1231 Encounter for screening mammogram for malignant neoplasm of breast: Secondary | ICD-10-CM | POA: Diagnosis not present

## 2017-10-17 DIAGNOSIS — R768 Other specified abnormal immunological findings in serum: Secondary | ICD-10-CM | POA: Diagnosis not present

## 2017-10-17 DIAGNOSIS — Z85850 Personal history of malignant neoplasm of thyroid: Secondary | ICD-10-CM | POA: Diagnosis not present

## 2017-10-17 DIAGNOSIS — J342 Deviated nasal septum: Secondary | ICD-10-CM | POA: Diagnosis not present

## 2017-10-17 DIAGNOSIS — E890 Postprocedural hypothyroidism: Secondary | ICD-10-CM | POA: Diagnosis not present

## 2017-11-26 ENCOUNTER — Encounter: Payer: Self-pay | Admitting: Endocrinology

## 2017-11-26 ENCOUNTER — Ambulatory Visit (INDEPENDENT_AMBULATORY_CARE_PROVIDER_SITE_OTHER): Payer: Medicare HMO | Admitting: Endocrinology

## 2017-11-26 DIAGNOSIS — C73 Malignant neoplasm of thyroid gland: Secondary | ICD-10-CM

## 2017-11-26 NOTE — Progress Notes (Signed)
Subjective:    Patient ID: Michele Glenn, female    DOB: 03-31-1955, 62 y.o.   MRN: [id number]  HPI Pt is referred by Dr Gaylyn Cheers, for nodular thyroid. I last saw pt in 2015. Pt was noted to have a multinodular goiter in 2008.  she has no h/o XRT or surgery to the neck.  bx then was c/w follicular lesion.  She had thyroidectomy in February of 2015.  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  She takes synthroid 100/d.  She has slight hoarseness sensation in the neck, and assoc fatigue.   Past Medical History:  Diagnosis Date  . Acute neck pain    bulging disk C4 through T1  . Anxiety   . Arthritis   . Bilateral swelling of feet   . Cancer [facility]) 04-2013   small amt cancer in thyroid     Thyroidectomy  04/2013  . Complication of anesthesia   . Fatigue   . Fibromyalgia   . GERD (gastroesophageal reflux disease)   . Headache(784.0)   . Heart murmur    HX OF  . Hypothyroidism   . Muscle pain   . PONV (postoperative nausea and vomiting)   . Thyroid disease     Past Surgical History:  Procedure Laterality Date  . ABDOMINAL HYSTERECTOMY     1 ovary remained  . BUNIONECTOMY     bilateral  . CARPAL TUNNEL REPAIR Right 1999  . KNEE SURGERY     right knee meniscus tear  . TOTAL KNEE ARTHROPLASTY Left 10/25/2013   Procedure: LEFT TOTAL KNEE ARTHROPLASTY;  Surgeon: Gearlean Alf, MD;  Location: [facility];  Service: Orthopedics;  Laterality: Left;  . TOTAL KNEE ARTHROPLASTY Right 02/09/2014   Procedure: RIGHT TOTAL KNEE ARTHROPLASTY;  Surgeon: Gearlean Alf, MD;  Location: [facility]S;  Service: Orthopedics;  Laterality: Right;  . TOTAL THYROIDECTOMY  04/06/2013    Social History   Socioeconomic History  . Marital status: Married    Spouse name: Not on file  . Number of children: Not on file  . Years of education: Not on file  . Highest education level: Not on file  Occupational History  . Not on file  Social Needs  . Financial resource strain: Not on file  . Food  insecurity:    Worry: Not on file    Inability: Not on file  . Transportation needs:    Medical: Not on file    Non-medical: Not on file  Tobacco Use  . Smoking status: Never Smoker  . Smokeless tobacco: Never Used  Substance and Sexual Activity  . Alcohol use: No  . Drug use: No  . Sexual activity: Not on file  Lifestyle  . Physical activity:    Days per week: Not on file    Minutes per session: Not on file  . Stress: Not on file  Relationships  . Social connections:    Talks on phone: Not on file    Gets together: Not on file    Attends religious service: Not on file    Active member of club or organization: Not on file    Attends meetings of clubs or organizations: Not on file    Relationship status: Not on file  . Intimate partner violence:    Fear of current or ex partner: Not on file    Emotionally abused: Not on file    Physically abused: Not on file    Forced sexual activity: Not  on file  Other Topics Concern  . Not on file  Social History Narrative  . Not on file    Current Outpatient Medications on File Prior to Visit  Medication Sig Dispense Refill  . aspirin EC 81 MG tablet Take 81 mg by mouth daily.    [name] Kitchen CALCIUM PO Take by mouth.    . Fish Oil-Cholecalciferol (OMEGA-3 + D PO) Take by mouth.    . levothyroxine (SYNTHROID, LEVOTHROID) 100 MCG tablet Take 100 mcg by mouth daily before breakfast.    . MAGNESIUM PO Take by mouth.    . Multiple Vitamins-Minerals (ZINC PO) Take by mouth.    . sertraline (ZOLOFT) 25 MG tablet Take 25 mg by mouth daily.    . traMADol (ULTRAM) 50 MG tablet Take 1-2 tablets (50-100 mg total) by mouth every 6 (six) hours as needed (mild pain). 60 tablet 1  . rivaroxaban (XARELTO) 10 MG TABS tablet Take 1 tablet (10 mg total) by mouth daily with breakfast. Take Xarelto for two and a half more weeks, then discontinue Xarelto. Once the patient has completed the Xarelto, they may resume the 81 mg Aspirin. (Patient not taking: Reported on  11/26/2017) 19 tablet 0   No current facility-administered medications on file prior to visit.     Allergies  Allergen Reactions  . Iodine-131 Other (See Comments)    Per patient "start to sneeze uncontrollable"  . Ivp Dye [Iodinated Diagnostic Agents]     Uncontrollable sneezing   . Ketek [Telithromycin] Hives  . Morphine And Related Nausea And Vomiting  . Other Hives    Per patient "have a allergy to Ketec "    Family History  Problem Relation Age of Onset  . Hypothyroidism Mother     BP 130/82 (BP Location: Right Arm)   Pulse 71   Ht _0  (1.626 m)   Wt 162 lb 12.8 oz (73.8 kg)   SpO2 96%   BMI 27.94 kg/m    Review of Systems Denies weight change, ant neck pain, visual loss, chest pain, sob, cough, dysphagia, diarrhea, itching, cold intolerance, and numbness.  She has slight headache, rhinorrhea, nausea, easy bruising, and flushing.  She attributes mild depression to recent death of her spouse.       Objective:   Physical Exam VS: see vs page GEN: no distress HEAD: head: no deformity eyes: no periorbital swelling, no proptosis external nose and ears are normal mouth: no lesion seen NECK: a healed scar is present.  I do not appreciate a nodule in the thyroid or elsewhere in the neck CHEST WALL: no deformity LUNGS: clear to auscultation CV: reg rate and rhythm, no murmur MUSCULOSKELETAL: muscle bulk and strength are grossly normal.  no obvious joint swelling.  gait is normal and steady EXTEMITIES: no deformity.  no ulcer on the feet.  feet are of normal color and temp.  no edema PULSES: dorsalis pedis intact bilat.  no carotid bruit NEURO:  cn 2-12 grossly intact.   readily moves all 4's.  sensation is intact to touch on the feet SKIN:  Normal texture and temperature.  No rash or suspicious lesion is visible.   NODES:  None palpable at the neck PSYCH: alert, well-oriented.  Does not appear anxious nor depressed.  I have reviewed outside records, and  summarized: Pt was noted to have goiter, and referred here.  At that ov, pt denies hoarseness  [place] (2019): thyroidectomy; no change in small amount of residual tissue.   outside  test results are reviewed: TG <2 Anti-TG Ab=49      Assessment & Plan:  Small focus of PTC: no evidence of recurrence Persistent anti-TG Ab: in a patient with h/o chronic thyroiditis, a slow decline in this would be expected.  Hoarseness, new, uncertain etiology.  I offered ref ENT. Postsurgical hypothyroidism: reportedly well-replaced.     Patient Instructions  You should have your neck examined, and the thyroid level checked each your, and as needed for symptoms. I would be happy to see you back here as needed.

## 2017-11-26 NOTE — Patient Instructions (Signed)
You should have your neck examined, and the thyroid level checked each your, and as needed for symptoms. I would be happy to see you back here as needed.

## 2017-11-29 DIAGNOSIS — C73 Malignant neoplasm of thyroid gland: Secondary | ICD-10-CM

## 2017-11-29 HISTORY — DX: Malignant neoplasm of thyroid gland: C73

## 2017-12-01 ENCOUNTER — Telehealth: Payer: Self-pay | Admitting: Endocrinology

## 2017-12-01 NOTE — Telephone Encounter (Signed)
Please have pt come in and sign release of information so that we may fax notes to requested office

## 2017-12-01 NOTE — Telephone Encounter (Signed)
[facility] has called wanting notes on this pt visit. Please advise ph # [phone]

## 2017-12-16 DIAGNOSIS — M542 Cervicalgia: Secondary | ICD-10-CM | POA: Diagnosis not present

## 2017-12-16 DIAGNOSIS — K130 Diseases of lips: Secondary | ICD-10-CM | POA: Diagnosis not present

## 2017-12-16 DIAGNOSIS — Z6828 Body mass index (BMI) 28.0-28.9, adult: Secondary | ICD-10-CM | POA: Diagnosis not present

## 2018-01-06 DIAGNOSIS — Z79899 Other long term (current) drug therapy: Secondary | ICD-10-CM | POA: Diagnosis not present

## 2018-03-31 DIAGNOSIS — H524 Presbyopia: Secondary | ICD-10-CM | POA: Diagnosis not present

## 2018-03-31 DIAGNOSIS — Z0100 Encounter for examination of eyes and vision without abnormal findings: Secondary | ICD-10-CM | POA: Diagnosis not present

## 2018-04-20 DIAGNOSIS — R51 Headache: Secondary | ICD-10-CM | POA: Diagnosis not present

## 2018-04-21 DIAGNOSIS — R42 Dizziness and giddiness: Secondary | ICD-10-CM | POA: Diagnosis not present

## 2018-04-21 DIAGNOSIS — R51 Headache: Secondary | ICD-10-CM | POA: Diagnosis not present

## 2018-04-21 DIAGNOSIS — H538 Other visual disturbances: Secondary | ICD-10-CM | POA: Diagnosis not present

## 2018-04-27 DIAGNOSIS — R030 Elevated blood-pressure reading, without diagnosis of hypertension: Secondary | ICD-10-CM | POA: Diagnosis not present

## 2018-04-27 DIAGNOSIS — Z91041 Radiographic dye allergy status: Secondary | ICD-10-CM | POA: Diagnosis not present

## 2018-04-27 DIAGNOSIS — Z8249 Family history of ischemic heart disease and other diseases of the circulatory system: Secondary | ICD-10-CM | POA: Diagnosis not present

## 2018-04-27 DIAGNOSIS — R32 Unspecified urinary incontinence: Secondary | ICD-10-CM | POA: Diagnosis not present

## 2018-04-27 DIAGNOSIS — Z79891 Long term (current) use of opiate analgesic: Secondary | ICD-10-CM | POA: Diagnosis not present

## 2018-04-27 DIAGNOSIS — Z803 Family history of malignant neoplasm of breast: Secondary | ICD-10-CM | POA: Diagnosis not present

## 2018-04-27 DIAGNOSIS — E039 Hypothyroidism, unspecified: Secondary | ICD-10-CM | POA: Diagnosis not present

## 2018-04-27 DIAGNOSIS — M792 Neuralgia and neuritis, unspecified: Secondary | ICD-10-CM | POA: Diagnosis not present

## 2018-04-27 DIAGNOSIS — R69 Illness, unspecified: Secondary | ICD-10-CM | POA: Diagnosis not present

## 2018-04-27 DIAGNOSIS — H547 Unspecified visual loss: Secondary | ICD-10-CM | POA: Diagnosis not present

## 2018-05-04 DIAGNOSIS — R030 Elevated blood-pressure reading, without diagnosis of hypertension: Secondary | ICD-10-CM | POA: Diagnosis not present

## 2018-05-04 DIAGNOSIS — R42 Dizziness and giddiness: Secondary | ICD-10-CM | POA: Diagnosis not present

## 2018-05-04 DIAGNOSIS — R51 Headache: Secondary | ICD-10-CM | POA: Diagnosis not present

## 2018-05-04 DIAGNOSIS — R0789 Other chest pain: Secondary | ICD-10-CM | POA: Diagnosis not present

## 2018-08-31 DIAGNOSIS — Z6828 Body mass index (BMI) 28.0-28.9, adult: Secondary | ICD-10-CM | POA: Diagnosis not present

## 2018-08-31 DIAGNOSIS — E890 Postprocedural hypothyroidism: Secondary | ICD-10-CM | POA: Diagnosis not present

## 2018-08-31 DIAGNOSIS — Z79899 Other long term (current) drug therapy: Secondary | ICD-10-CM | POA: Diagnosis not present

## 2018-08-31 DIAGNOSIS — E785 Hyperlipidemia, unspecified: Secondary | ICD-10-CM | POA: Diagnosis not present

## 2018-08-31 DIAGNOSIS — Z1331 Encounter for screening for depression: Secondary | ICD-10-CM | POA: Diagnosis not present

## 2018-08-31 DIAGNOSIS — Z1231 Encounter for screening mammogram for malignant neoplasm of breast: Secondary | ICD-10-CM | POA: Diagnosis not present

## 2018-08-31 DIAGNOSIS — Z0000 Encounter for general adult medical examination without abnormal findings: Secondary | ICD-10-CM | POA: Diagnosis not present

## 2018-08-31 DIAGNOSIS — E663 Overweight: Secondary | ICD-10-CM | POA: Diagnosis not present

## 2018-08-31 DIAGNOSIS — Z1339 Encounter for screening examination for other mental health and behavioral disorders: Secondary | ICD-10-CM | POA: Diagnosis not present

## 2018-08-31 DIAGNOSIS — E2839 Other primary ovarian failure: Secondary | ICD-10-CM | POA: Diagnosis not present

## 2018-10-28 NOTE — Progress Notes (Signed)
Chief Complaint  Patient presents with  . New Patient (Initial Visit)    Abnormal EKG   History of Present Illness: 63 yo female with history of anxiety, GERD, fibromyalgia and hypothyroidism who is here today as a new patient for the evaluation of an abnormal EKG in primary care. Her EKG in March 2020 in primary care showed sinus rhythm with possible left atrial enlargement. She feels well overall. The patient denies any chest pain, dyspnea, palpitations, lower extremity edema, orthopnea, PND, dizziness, near syncope or syncope.   Primary Care Physician: Ernestene Kiel, MD Haydee Salter, NP)  Past Medical History:  Diagnosis Date  . Acute neck pain    bulging disk C4 through T1  . Anxiety   . Arthritis   . Bilateral swelling of feet   . Cancer [facility]) 04-2013   small amt cancer in thyroid     Thyroidectomy  04/2013  . Complication of anesthesia   . DDD (degenerative disc disease), cervical   . Dry eyes   . Fatigue   . Fibromyalgia   . GERD (gastroesophageal reflux disease)   . Headache(784.0)   . Heart murmur    HX OF  . Heel spur 10/02/2010  . Hypothyroidism   . Hypothyroidism   . Muscle pain   . OA (osteoarthritis) of knee 10/25/2013  . Papillary thyroid carcinoma ([place]) 11/29/2017  . Paresthesia 04/30/2013  . Plantar fasciitis 10/02/2010  . PONV (postoperative nausea and vomiting)   . Postsurgical hypothyroidism 04/30/2013  . Thyroid disease     Past Surgical History:  Procedure Laterality Date  . ABDOMINAL HYSTERECTOMY     1 ovary remained  . BUNIONECTOMY     bilateral  . CARPAL TUNNEL REPAIR Right 1999  . KNEE SURGERY     right knee meniscus tear  . TOTAL KNEE ARTHROPLASTY Left 10/25/2013   Procedure: LEFT TOTAL KNEE ARTHROPLASTY;  Surgeon: Gearlean Alf, MD;  Location: [facility];  Service: Orthopedics;  Laterality: Left;  . TOTAL KNEE ARTHROPLASTY Right 02/09/2014   Procedure: RIGHT TOTAL KNEE ARTHROPLASTY;  Surgeon: Gearlean Alf, MD;  Location: [facility];   Service: Orthopedics;  Laterality: Right;  . TOTAL THYROIDECTOMY  04/06/2013    Current Outpatient Medications  Medication Sig Dispense Refill  . Ascorbic Acid (VITAMIN C) 1000 MG tablet 1,000 mg. TAKE BY MOUTH TWO TO THREE TIMES A WEEK    . aspirin EC 81 MG tablet Take 81 mg by mouth daily.    [name] Kitchen CALCIUM PO Take by mouth.    . desonide (DESOWEN) 0.05 % cream Apply topically 2 (two) times daily.    . Fish Oil-Cholecalciferol (OMEGA-3 + D PO) Take by mouth.    . levothyroxine (SYNTHROID) 88 MCG tablet Take 88 mcg by mouth daily before breakfast.     . MAGNESIUM PO Take by mouth.    . Multiple Vitamins-Minerals (ZINC PO) Take by mouth.    . rivaroxaban (XARELTO) 10 MG TABS tablet Take 1 tablet (10 mg total) by mouth daily with breakfast. Take Xarelto for two and a half more weeks, then discontinue Xarelto. Once the patient has completed the Xarelto, they may resume the 81 mg Aspirin. 19 tablet 0  . sertraline (ZOLOFT) 25 MG tablet Take 25 mg by mouth daily.    . traMADol (ULTRAM) 50 MG tablet Take 1-2 tablets (50-100 mg total) by mouth every 6 (six) hours as needed (mild pain). 60 tablet 1   No current facility-administered medications for this visit.  Allergies  Allergen Reactions  . Iodine-131 Other (See Comments)    Per patient "start to sneeze uncontrollable"  . Ivp Dye [Iodinated Diagnostic Agents]     Uncontrollable sneezing   . Ketek [Telithromycin] Hives  . Morphine And Related Nausea And Vomiting  . Other Hives    Per patient "have a allergy to [employer] "    Social History   Socioeconomic History  . Marital status: Married    Spouse name: Not on file  . Number of children: Not on file  . Years of education: Not on file  . Highest education level: Not on file  Occupational History  . Occupation: Psychiatric nurse  Social Needs  . Financial resource strain: Not on file  . Food insecurity    Worry: Not on file    Inability: Not on file  . Transportation needs     Medical: Not on file    Non-medical: Not on file  Tobacco Use  . Smoking status: Never Smoker  . Smokeless tobacco: Never Used  Substance and Sexual Activity  . Alcohol use: No  . Drug use: No  . Sexual activity: Not on file  Lifestyle  . Physical activity    Days per week: Not on file    Minutes per session: Not on file  . Stress: Not on file  Relationships  . Social Herbalist on phone: Not on file    Gets together: Not on file    Attends religious service: Not on file    Active member of club or organization: Not on file    Attends meetings of clubs or organizations: Not on file    Relationship status: Not on file  . Intimate partner violence    Fear of current or ex partner: Not on file    Emotionally abused: Not on file    Physically abused: Not on file    Forced sexual activity: Not on file  Other Topics Concern  . Not on file  Social History Narrative  . Not on file    Family History  Problem Relation Age of Onset  . Hypothyroidism Mother   . AAA (abdominal aortic aneurysm) Father     Review of Systems:  As stated in the HPI and otherwise negative.   BP 124/72   Pulse 67   Ht 5\' 4"  (1.626 m)   Wt 161 lb 12.8 oz (73.4 kg)   SpO2 98%   BMI 27.77 kg/m   Physical Examination: General: Well developed, well nourished, NAD  HEENT: OP clear, mucus membranes moist  SKIN: warm, dry. No rashes. Neuro: No focal deficits  Musculoskeletal: Muscle strength 5/5 all ext  Psychiatric: Mood and affect normal  Neck: No JVD, no carotid bruits, no thyromegaly, no lymphadenopathy.  Lungs:Clear bilaterally, no wheezes, rhonci, crackles Cardiovascular: Regular rate and rhythm. No murmurs, gallops or rubs. Abdomen:Soft. Bowel sounds present. Non-tender.  Extremities: No lower extremity edema. Pulses are 2 + in the bilateral DP/PT.  EKG:  EKG is ordered today. The ekg ordered today demonstrates NSR, rate 67 bpm. Incomplete RBBB, possible LAE  Recent Labs: No  results found for requested labs within last 8760 hours.   Lipid Panel No results found for: CHOL, TRIG, HDL, CHOLHDL, VLDL, LDLCALC, LDLDIRECT   Wt Readings from Last 3 Encounters:  10/29/18 161 lb 12.8 oz (73.4 kg)  11/26/17 162 lb 12.8 oz (73.8 kg)  02/09/14 161 lb (73 kg)     Assessment and Plan:  1. Abnormal EKG: EKG in primary care with possible left artrial enlargement. EKG here today with same abnormality. Cardiac exam is normal. Will arrange an echo to exclude structural heart disease and assess LV systolic function.   Current medicines are reviewed at length with the patient today.  The patient does not have concerns regarding medicines.  The following changes have been made:  no change  Labs/ tests ordered today include:   Orders Placed This Encounter  Procedures  . EKG 12-Lead  . ECHOCARDIOGRAM COMPLETE     Disposition:   FU with me in 1 year   Signed, Lauree Chandler, MD 10/29/2018 10:01 AM    [facility] [address], [place], [place]  [address] Phone: [phone]; Fax: [phone]

## 2018-10-29 ENCOUNTER — Encounter: Payer: Self-pay | Admitting: Cardiovascular Disease

## 2018-10-29 ENCOUNTER — Ambulatory Visit (INDEPENDENT_AMBULATORY_CARE_PROVIDER_SITE_OTHER): Payer: Medicare HMO | Admitting: Cardiovascular Disease

## 2018-10-29 ENCOUNTER — Other Ambulatory Visit: Payer: Self-pay

## 2018-10-29 VITALS — BP 124/72 | HR 67 | Ht 64.0 in | Wt 161.8 lb

## 2018-10-29 DIAGNOSIS — R9431 Abnormal electrocardiogram [ECG] [EKG]: Secondary | ICD-10-CM | POA: Diagnosis not present

## 2018-10-29 NOTE — Patient Instructions (Signed)
Medication Instructions:  Your physician recommends that you continue on your current medications as directed. Please refer to the Current Medication list given to you today.  If you need a refill on your cardiac medications before your next appointment, please call your pharmacy.   Lab work: None If you have labs (blood work) drawn today and your tests are completely normal, you will receive your results only by: . MyChart Message (if you have MyChart) OR . A paper copy in the mail If you have any lab test that is abnormal or we need to change your treatment, we will call you to review the results.  Testing/Procedures: Your physician has requested that you have an echocardiogram. Echocardiography is a painless test that uses sound waves to create images of your heart. It provides your doctor with information about the size and shape of your heart and how well your heart's chambers and valves are working. This procedure takes approximately one hour. There are no restrictions for this procedure.   Follow-Up: At [facility], you and your health needs are our priority.  As part of our continuing mission to provide you with exceptional heart care, we have created designated Provider Care Teams.  These Care Teams include your primary Cardiologist (physician) and Advanced Practice Providers (APPs -  Physician Assistants and Nurse Practitioners) who all work together to provide you with the care you need, when you need it. You will need a follow up appointment in 12 months.  Please call our office 2 months in advance to schedule this appointment.  You may see Dr. McAlhany or one of the following Advanced Practice Providers on your designated Care Team:   Brittainy Simmons, PA-C Dayna Dunn, PA-C . Michele Lenze, PA-C  Any Other Special Instructions Will Be Listed Below (If Applicable).    

## 2018-11-02 ENCOUNTER — Other Ambulatory Visit: Payer: Self-pay

## 2018-11-02 ENCOUNTER — Ambulatory Visit (HOSPITAL_COMMUNITY): Payer: Medicare HMO | Attending: Cardiovascular Disease

## 2018-11-02 DIAGNOSIS — R9431 Abnormal electrocardiogram [ECG] [EKG]: Secondary | ICD-10-CM | POA: Diagnosis not present

## 2018-11-11 DIAGNOSIS — E890 Postprocedural hypothyroidism: Secondary | ICD-10-CM | POA: Diagnosis not present

## 2018-11-24 ENCOUNTER — Other Ambulatory Visit: Payer: Self-pay

## 2018-11-26 ENCOUNTER — Other Ambulatory Visit: Payer: Self-pay

## 2018-11-26 ENCOUNTER — Ambulatory Visit (INDEPENDENT_AMBULATORY_CARE_PROVIDER_SITE_OTHER): Payer: Medicare HMO | Admitting: Endocrinology

## 2018-11-26 ENCOUNTER — Encounter: Payer: Self-pay | Admitting: Endocrinology

## 2018-11-26 VITALS — BP 136/78 | HR 69 | Ht 64.0 in | Wt 162.2 lb

## 2018-11-26 DIAGNOSIS — C73 Malignant neoplasm of thyroid gland: Secondary | ICD-10-CM

## 2018-11-26 DIAGNOSIS — R200 Anesthesia of skin: Secondary | ICD-10-CM | POA: Diagnosis not present

## 2018-11-26 DIAGNOSIS — E890 Postprocedural hypothyroidism: Secondary | ICD-10-CM

## 2018-11-26 NOTE — Patient Instructions (Addendum)
Blood tests are requested for you today.  We'll let you know about the results.  You should have your neck examined, and the thyroid level checked each your, and as needed for symptoms. I would be happy to see you back here as needed.

## 2018-11-26 NOTE — Progress Notes (Signed)
Subjective:    Patient ID: Michele Glenn, female    DOB: Jun 11, 1955, 63 y.o.   MRN: [id number]  HPI Pt returns for f/u of postop hypothyroidism (pt was noted to have a multinodular goiter in 2008; bx then was c/w follicular lesion; she had thyroidectomy in February of 2015; pathology showed a very small focus of PTC; she was rx'ed synthroid; f/u US in 2019 showed no change in small amount of residual tissue; in 2019, TG was <2, but ATG Ab=49).  2 days ago, synthroid was reduced to 75/d.  She has slight hair loss on the head, and assoc myalgias.   Past Medical History:  Diagnosis Date  . Acute neck pain    bulging disk C4 through T1  . Anxiety   . Arthritis   . Bilateral swelling of feet   . Cancer [facility]) 04-2013   small amt cancer in thyroid     Thyroidectomy  04/2013  . Complication of anesthesia   . DDD (degenerative disc disease), cervical   . Dry eyes   . Fatigue   . Fibromyalgia   . GERD (gastroesophageal reflux disease)   . Headache(784.0)   . Heart murmur    HX OF  . Heel spur 10/02/2010  . Hypothyroidism   . Hypothyroidism   . Muscle pain   . OA (osteoarthritis) of knee 10/25/2013  . Papillary thyroid carcinoma ([place]) 11/29/2017  . Paresthesia 04/30/2013  . Plantar fasciitis 10/02/2010  . PONV (postoperative nausea and vomiting)   . Postsurgical hypothyroidism 04/30/2013  . Thyroid disease     Past Surgical History:  Procedure Laterality Date  . ABDOMINAL HYSTERECTOMY     1 ovary remained  . BUNIONECTOMY     bilateral  . CARPAL TUNNEL REPAIR Right 1999  . KNEE SURGERY     right knee meniscus tear  . TOTAL KNEE ARTHROPLASTY Left 10/25/2013   Procedure: LEFT TOTAL KNEE ARTHROPLASTY;  Surgeon: Gearlean Alf, MD;  Location: [facility];  Service: Orthopedics;  Laterality: Left;  . TOTAL KNEE ARTHROPLASTY Right 02/09/2014   Procedure: RIGHT TOTAL KNEE ARTHROPLASTY;  Surgeon: Gearlean Alf, MD;  Location: [facility];  Service: Orthopedics;  Laterality: Right;  . TOTAL THYROIDECTOMY   04/06/2013    Social History   Socioeconomic History  . Marital status: Married    Spouse name: Not on file  . Number of children: Not on file  . Years of education: Not on file  . Highest education level: Not on file  Occupational History  . Occupation: Psychiatric nurse  Social Needs  . Financial resource strain: Not on file  . Food insecurity    Worry: Not on file    Inability: Not on file  . Transportation needs    Medical: Not on file    Non-medical: Not on file  Tobacco Use  . Smoking status: Never Smoker  . Smokeless tobacco: Never Used  Substance and Sexual Activity  . Alcohol use: No  . Drug use: No  . Sexual activity: Not on file  Lifestyle  . Physical activity    Days per week: Not on file    Minutes per session: Not on file  . Stress: Not on file  Relationships  . Social Herbalist on phone: Not on file    Gets together: Not on file    Attends religious service: Not on file    Active member of club or organization: Not on file    Attends meetings  of clubs or organizations: Not on file    Relationship status: Not on file  . Intimate partner violence    Fear of current or ex partner: Not on file    Emotionally abused: Not on file    Physically abused: Not on file    Forced sexual activity: Not on file  Other Topics Concern  . Not on file  Social History Narrative  . Not on file    Current Outpatient Medications on File Prior to Visit  Medication Sig Dispense Refill  . Ascorbic Acid (VITAMIN C) 1000 MG tablet 1,000 mg. TAKE BY MOUTH TWO TO THREE TIMES A WEEK    . aspirin EC 81 MG tablet Take 81 mg by mouth daily.    [name] Kitchen CALCIUM PO Take by mouth.    . desonide (DESOWEN) 0.05 % cream Apply topically 2 (two) times daily.    . Fish Oil-Cholecalciferol (OMEGA-3 + D PO) Take by mouth.    . levothyroxine (SYNTHROID) 75 MCG tablet Take 75 mcg by mouth daily before breakfast.     . MAGNESIUM PO Take by mouth.    . Multiple Vitamins-Minerals (ZINC  PO) Take by mouth.    . rivaroxaban (XARELTO) 10 MG TABS tablet Take 1 tablet (10 mg total) by mouth daily with breakfast. Take Xarelto for two and a half more weeks, then discontinue Xarelto. Once the patient has completed the Xarelto, they may resume the 81 mg Aspirin. 19 tablet 0  . sertraline (ZOLOFT) 25 MG tablet Take 25 mg by mouth daily.    . traMADol (ULTRAM) 50 MG tablet Take 1-2 tablets (50-100 mg total) by mouth every 6 (six) hours as needed (mild pain). 60 tablet 1   No current facility-administered medications on file prior to visit.     Allergies  Allergen Reactions  . Iodine-131 Other (See Comments)    Per patient "start to sneeze uncontrollable"  . Ivp Dye [Iodinated Diagnostic Agents]     Uncontrollable sneezing   . Ketek [Telithromycin] Hives  . Morphine And Related Nausea And Vomiting  . Other Hives    Per patient "have a allergy to Ketec "    Family History  Problem Relation Age of Onset  . Hypothyroidism Mother   . AAA (abdominal aortic aneurysm) Father     BP 136/78 (BP Location: Left Arm, Patient Position: Sitting, Cuff Size: Normal)   Pulse 69   Ht _0  (1.626 m)   Wt 162 lb 3.2 oz (73.6 kg)   SpO2 98%   BMI 27.84 kg/m   Review of Systems She has anxiety, muscle cramps, acral numbness, and fatigue.      Objective:   Physical Exam VITAL SIGNS:  See vs page GENERAL: no distress Neck: a healed scar is present.  I do not appreciate a nodule in the thyroid or elsewhere in the neck.       Assessment & Plan:  Hypothyroidism: it is unclear why her dosage requirement is decreasing Hair loss: it told pt this is usually autoimmune, especially in view of pos ATG antibody. Muscle cramps: recheck labs  Patient Instructions  Blood tests are requested for you today.  We'll let you know about the results.  You should have your neck examined, and the thyroid level checked each your, and as needed for symptoms. I would be happy to see you back here as  needed.

## 2018-11-30 DIAGNOSIS — M8589 Other specified disorders of bone density and structure, multiple sites: Secondary | ICD-10-CM | POA: Diagnosis not present

## 2018-11-30 DIAGNOSIS — E2839 Other primary ovarian failure: Secondary | ICD-10-CM | POA: Diagnosis not present

## 2018-11-30 DIAGNOSIS — M8580 Other specified disorders of bone density and structure, unspecified site: Secondary | ICD-10-CM | POA: Diagnosis not present

## 2018-11-30 DIAGNOSIS — Z1231 Encounter for screening mammogram for malignant neoplasm of breast: Secondary | ICD-10-CM | POA: Diagnosis not present

## 2018-12-03 ENCOUNTER — Ambulatory Visit (INDEPENDENT_AMBULATORY_CARE_PROVIDER_SITE_OTHER): Payer: Medicare HMO | Admitting: Otolaryngology

## 2018-12-21 DIAGNOSIS — E890 Postprocedural hypothyroidism: Secondary | ICD-10-CM | POA: Diagnosis not present

## 2018-12-21 DIAGNOSIS — C73 Malignant neoplasm of thyroid gland: Secondary | ICD-10-CM | POA: Diagnosis not present

## 2018-12-21 DIAGNOSIS — R200 Anesthesia of skin: Secondary | ICD-10-CM | POA: Diagnosis not present

## 2018-12-21 DIAGNOSIS — R252 Cramp and spasm: Secondary | ICD-10-CM | POA: Diagnosis not present

## 2018-12-22 DIAGNOSIS — M19072 Primary osteoarthritis, left ankle and foot: Secondary | ICD-10-CM | POA: Diagnosis not present

## 2018-12-22 DIAGNOSIS — M150 Primary generalized (osteo)arthritis: Secondary | ICD-10-CM | POA: Diagnosis not present

## 2018-12-31 DIAGNOSIS — M79672 Pain in left foot: Secondary | ICD-10-CM | POA: Diagnosis not present

## 2018-12-31 DIAGNOSIS — M19072 Primary osteoarthritis, left ankle and foot: Secondary | ICD-10-CM | POA: Diagnosis not present

## 2019-01-01 ENCOUNTER — Telehealth: Payer: Self-pay | Admitting: Endocrinology

## 2019-01-01 ENCOUNTER — Other Ambulatory Visit: Payer: Self-pay

## 2019-01-01 NOTE — Telephone Encounter (Signed)
Please advise 

## 2019-01-01 NOTE — Telephone Encounter (Signed)
I need thyroglobulin result.  It is listed as not done yet.

## 2019-01-01 NOTE — Telephone Encounter (Signed)
Patient requests to be called at ph# [phone] to be given lab results

## 2019-01-01 NOTE — Telephone Encounter (Signed)
Could you please follow up on the status of this result

## 2019-01-03 LAB — VITAMIN B12: Vitamin B-12: 1146 pg/mL (ref 232–1245)

## 2019-01-03 LAB — PTH, INTACT AND CALCIUM
Calcium: 9.5 mg/dL (ref 8.7–10.3)
PTH: 26 pg/mL (ref 15–65)

## 2019-01-03 LAB — THYROGLOBULIN LEVEL: Thyroglobulin (TG-RIA): <2 ng/mL

## 2019-01-03 LAB — THYROGLOBULIN ANTIBODY: Thyroglobulin Antibody: 39.1 IU/mL — ABNORMAL HIGH (ref 0.0–0.9)

## 2019-01-03 LAB — TSH: TSH: 1.48 u[IU]/mL (ref 0.450–4.500)

## 2019-01-03 LAB — T4, FREE: Free T4: 1.51 ng/dL (ref 0.82–1.77)

## 2019-01-03 LAB — VITAMIN D 25 HYDROXY (VIT D DEFICIENCY, FRACTURES): Vit D, 25-Hydroxy: 52.9 ng/mL (ref 30.0–100.0)

## 2019-01-11 ENCOUNTER — Telehealth: Payer: Self-pay | Admitting: Endocrinology

## 2019-01-11 NOTE — Telephone Encounter (Signed)
Called pt and went over ALL responses in detail. Verbalized acceptance, understanding and appreciation of all information provided.  ------   Notes recorded by Aleatha Borer, LPN on [id number] at [id number] AM EST  LVM requesting returned call  ------   Notes recorded by Renato Shin, MD on 01/10/2019 at 7:42 PM EST  1. What does this mean?  OVERALL, YOUR HEALTH IS VERY GOOD  2. Do I need to take Synthroid rather than a synthetic?  SYNTHROID IS THE BEST OPTION, AS YOUR BLOOD TESTS ARE NORMAL ON IT  3. Does my parathyroid have anything to do with this?  GOOD NEWS: PARATHYROID WAS NORMAL-GOOD  4. Does this change my current treatment plan?  NO--YOUR ARE DOING WELL  5. Since I have been on 177mcg, why the sudden change?  SOME PEOPLE NEED MORE OR LESS THAN AVERAGE, BUT THIS DIFFERENCE DOES NOT AFFECT YOUR HEALTH  5. Does taking a reduced mcg help with the hair loss?  THE BEST WE CAN DO FOR THE HAIR LOSS IS TO KEEP YOUR THYROIDS LEVEL NORMAL, WHICH IT IS  6. What can I do to help this?  FROM A HORMONAL STANDPOINT, YOU ARE DOING ALL YOU CAN DO  7. When my thyroid was removed, they found I have a bad cancer. Does this mean it will lead up to cancer?  A TINY AMOUNT OF CANCER WAS FOUND, WHICH IS FOUND IN MANY PEOPLE. YOU SHOULD HAVE YOUR THYROID EXAMINED EACH YEAR. DR PROCHNAU OR I WOULD BE HAPPY TO DO THIS

## 2019-01-11 NOTE — Telephone Encounter (Signed)
Patient returned call re: questions patient had asked last Monday. Please call patient at ph# [phone].

## 2019-01-12 DIAGNOSIS — M79672 Pain in left foot: Secondary | ICD-10-CM | POA: Insufficient documentation

## 2019-01-12 DIAGNOSIS — M19072 Primary osteoarthritis, left ankle and foot: Secondary | ICD-10-CM | POA: Diagnosis not present

## 2019-02-15 DIAGNOSIS — M150 Primary generalized (osteo)arthritis: Secondary | ICD-10-CM | POA: Diagnosis not present

## 2019-02-15 DIAGNOSIS — Z6827 Body mass index (BMI) 27.0-27.9, adult: Secondary | ICD-10-CM | POA: Diagnosis not present

## 2019-02-15 DIAGNOSIS — E890 Postprocedural hypothyroidism: Secondary | ICD-10-CM | POA: Diagnosis not present

## 2019-02-15 DIAGNOSIS — R69 Illness, unspecified: Secondary | ICD-10-CM | POA: Diagnosis not present

## 2019-03-23 DIAGNOSIS — M7989 Other specified soft tissue disorders: Secondary | ICD-10-CM | POA: Diagnosis not present

## 2019-03-23 DIAGNOSIS — J310 Chronic rhinitis: Secondary | ICD-10-CM | POA: Diagnosis not present

## 2019-05-17 DIAGNOSIS — M1990 Unspecified osteoarthritis, unspecified site: Secondary | ICD-10-CM | POA: Diagnosis not present

## 2019-05-17 DIAGNOSIS — E039 Hypothyroidism, unspecified: Secondary | ICD-10-CM | POA: Diagnosis not present

## 2019-05-17 DIAGNOSIS — Z803 Family history of malignant neoplasm of breast: Secondary | ICD-10-CM | POA: Diagnosis not present

## 2019-05-17 DIAGNOSIS — F419 Anxiety disorder, unspecified: Secondary | ICD-10-CM | POA: Diagnosis not present

## 2019-05-17 DIAGNOSIS — R030 Elevated blood-pressure reading, without diagnosis of hypertension: Secondary | ICD-10-CM | POA: Diagnosis not present

## 2019-05-17 DIAGNOSIS — R32 Unspecified urinary incontinence: Secondary | ICD-10-CM | POA: Diagnosis not present

## 2019-05-17 DIAGNOSIS — R69 Illness, unspecified: Secondary | ICD-10-CM | POA: Diagnosis not present

## 2019-05-17 DIAGNOSIS — Z79891 Long term (current) use of opiate analgesic: Secondary | ICD-10-CM | POA: Diagnosis not present

## 2019-05-17 DIAGNOSIS — Z8249 Family history of ischemic heart disease and other diseases of the circulatory system: Secondary | ICD-10-CM | POA: Diagnosis not present

## 2019-05-17 DIAGNOSIS — G8929 Other chronic pain: Secondary | ICD-10-CM | POA: Diagnosis not present

## 2019-05-21 DIAGNOSIS — Z87898 Personal history of other specified conditions: Secondary | ICD-10-CM | POA: Diagnosis not present

## 2019-05-21 DIAGNOSIS — R490 Dysphonia: Secondary | ICD-10-CM | POA: Diagnosis not present

## 2019-05-21 DIAGNOSIS — J029 Acute pharyngitis, unspecified: Secondary | ICD-10-CM | POA: Diagnosis not present

## 2019-05-21 DIAGNOSIS — Z85850 Personal history of malignant neoplasm of thyroid: Secondary | ICD-10-CM | POA: Diagnosis not present

## 2019-05-21 DIAGNOSIS — Z8616 Personal history of COVID-19: Secondary | ICD-10-CM | POA: Diagnosis not present

## 2019-05-21 DIAGNOSIS — E890 Postprocedural hypothyroidism: Secondary | ICD-10-CM | POA: Diagnosis not present

## 2019-05-21 DIAGNOSIS — J342 Deviated nasal septum: Secondary | ICD-10-CM | POA: Diagnosis not present

## 2019-08-05 DIAGNOSIS — R69 Illness, unspecified: Secondary | ICD-10-CM | POA: Diagnosis not present

## 2019-08-05 DIAGNOSIS — E890 Postprocedural hypothyroidism: Secondary | ICD-10-CM | POA: Diagnosis not present

## 2019-08-05 DIAGNOSIS — R5383 Other fatigue: Secondary | ICD-10-CM | POA: Diagnosis not present

## 2019-08-05 DIAGNOSIS — A770 Spotted fever due to Rickettsia rickettsii: Secondary | ICD-10-CM | POA: Diagnosis not present

## 2019-08-05 DIAGNOSIS — W57XXXA Bitten or stung by nonvenomous insect and other nonvenomous arthropods, initial encounter: Secondary | ICD-10-CM | POA: Diagnosis not present

## 2019-08-05 DIAGNOSIS — M150 Primary generalized (osteo)arthritis: Secondary | ICD-10-CM | POA: Diagnosis not present

## 2019-08-05 DIAGNOSIS — E7800 Pure hypercholesterolemia, unspecified: Secondary | ICD-10-CM | POA: Diagnosis not present

## 2019-08-05 DIAGNOSIS — Z79899 Other long term (current) drug therapy: Secondary | ICD-10-CM | POA: Diagnosis not present

## 2019-08-05 DIAGNOSIS — Z6828 Body mass index (BMI) 28.0-28.9, adult: Secondary | ICD-10-CM | POA: Diagnosis not present

## 2019-09-10 ENCOUNTER — Other Ambulatory Visit: Payer: Self-pay | Admitting: Internal Medicine

## 2019-10-12 DIAGNOSIS — Z1231 Encounter for screening mammogram for malignant neoplasm of breast: Secondary | ICD-10-CM | POA: Diagnosis not present

## 2019-10-12 DIAGNOSIS — Z1159 Encounter for screening for other viral diseases: Secondary | ICD-10-CM | POA: Diagnosis not present

## 2019-10-12 DIAGNOSIS — Z1339 Encounter for screening examination for other mental health and behavioral disorders: Secondary | ICD-10-CM | POA: Diagnosis not present

## 2019-10-12 DIAGNOSIS — Z0001 Encounter for general adult medical examination with abnormal findings: Secondary | ICD-10-CM | POA: Diagnosis not present

## 2019-10-12 DIAGNOSIS — R5383 Other fatigue: Secondary | ICD-10-CM | POA: Diagnosis not present

## 2019-10-12 DIAGNOSIS — E890 Postprocedural hypothyroidism: Secondary | ICD-10-CM | POA: Diagnosis not present

## 2019-10-12 DIAGNOSIS — Z6828 Body mass index (BMI) 28.0-28.9, adult: Secondary | ICD-10-CM | POA: Diagnosis not present

## 2019-10-12 DIAGNOSIS — M2550 Pain in unspecified joint: Secondary | ICD-10-CM | POA: Diagnosis not present

## 2019-10-12 DIAGNOSIS — Z1211 Encounter for screening for malignant neoplasm of colon: Secondary | ICD-10-CM | POA: Diagnosis not present

## 2019-10-12 DIAGNOSIS — Z1331 Encounter for screening for depression: Secondary | ICD-10-CM | POA: Diagnosis not present

## 2019-10-19 DIAGNOSIS — R5383 Other fatigue: Secondary | ICD-10-CM | POA: Diagnosis not present

## 2019-10-19 DIAGNOSIS — M2550 Pain in unspecified joint: Secondary | ICD-10-CM | POA: Diagnosis not present

## 2019-10-19 DIAGNOSIS — R0602 Shortness of breath: Secondary | ICD-10-CM | POA: Diagnosis not present

## 2019-10-20 ENCOUNTER — Encounter: Payer: Self-pay | Admitting: Gastroenterology

## 2019-11-26 ENCOUNTER — Encounter: Payer: Self-pay | Admitting: Gastroenterology

## 2019-11-26 ENCOUNTER — Ambulatory Visit (AMBULATORY_SURGERY_CENTER): Payer: Self-pay | Admitting: *Deleted

## 2019-11-26 ENCOUNTER — Other Ambulatory Visit: Payer: Self-pay

## 2019-11-26 VITALS — Ht 64.0 in | Wt 161.0 lb

## 2019-11-26 DIAGNOSIS — Z86010 Personal history of colonic polyps: Secondary | ICD-10-CM

## 2019-11-26 DIAGNOSIS — Z01818 Encounter for other preprocedural examination: Secondary | ICD-10-CM

## 2019-11-26 NOTE — Progress Notes (Signed)

## 2019-12-02 DIAGNOSIS — Z1231 Encounter for screening mammogram for malignant neoplasm of breast: Secondary | ICD-10-CM | POA: Diagnosis not present

## 2019-12-20 ENCOUNTER — Other Ambulatory Visit: Payer: Self-pay | Admitting: Gastroenterology

## 2019-12-20 DIAGNOSIS — Z1159 Encounter for screening for other viral diseases: Secondary | ICD-10-CM | POA: Diagnosis not present

## 2019-12-20 LAB — SARS CORONAVIRUS 2 (TAT 6-24 HRS): SARS Coronavirus 2: NEGATIVE

## 2019-12-21 ENCOUNTER — Other Ambulatory Visit: Payer: Self-pay

## 2019-12-21 ENCOUNTER — Ambulatory Visit (AMBULATORY_SURGERY_CENTER): Payer: Medicare HMO | Admitting: Gastroenterology

## 2019-12-21 ENCOUNTER — Encounter: Payer: Self-pay | Admitting: Gastroenterology

## 2019-12-21 VITALS — BP 136/71 | HR 65 | Temp 96.9°F | Resp 13 | Ht 64.0 in | Wt 161.0 lb

## 2019-12-21 DIAGNOSIS — Z86010 Personal history of colonic polyps: Secondary | ICD-10-CM

## 2019-12-21 MED ORDER — SODIUM CHLORIDE 0.9 % IV SOLN: 500.0000 mL | Freq: Once | INTRAVENOUS | Status: DC

## 2019-12-21 NOTE — Op Note (Signed)
[facility] Patient Name: Michele Glenn Procedure Date: 12/21/2019 10:45 AM MRN: [id number] Endoscopist: Jackquline Denmark , MD Age: 64 Referring MD:  Date of Birth: October 03, 1955 Gender: Female Account #: [id number] Procedure:                Colonoscopy Indications:              Colon cancer screening in patient at increased                            risk: Family history of 1st-degree relative with                            colon polyps Medicines:                Monitored Anesthesia Care Procedure:                Pre-Anesthesia Assessment:                           - Prior to the procedure, a History and Physical                            was performed, and patient medications and                            allergies were reviewed. The patient's tolerance of                            previous anesthesia was also reviewed. The risks                            and benefits of the procedure and the sedation                            options and risks were discussed with the patient.                            All questions were answered, and informed consent                            was obtained. Prior Anticoagulants: The patient has                            taken no previous anticoagulant or antiplatelet                            agents. ASA Grade Assessment: II - A patient with                            mild systemic disease. After reviewing the risks                            and benefits, the patient was deemed in  satisfactory condition to undergo the procedure.                           After obtaining informed consent, the colonoscope                            was passed under direct vision. Throughout the                            procedure, the patient's blood pressure, pulse, and                            oxygen saturations were monitored continuously. The                            Colonoscope was introduced through the anus and                             advanced to the 2 cm into the ileum. The                            colonoscopy was performed without difficulty. The                            patient tolerated the procedure well. The quality                            of the bowel preparation was good. The terminal                            ileum, ileocecal valve, appendiceal orifice, and                            rectum were photographed. Scope In: 10:49:10 AM Scope Out: 11:01:58 AM Scope Withdrawal Time: 0 hours 6 minutes 58 seconds  Total Procedure Duration: 0 hours 12 minutes 48 seconds  Findings:                 A few small-mouthed diverticula were found in the                            sigmoid colon.                           Non-bleeding internal hemorrhoids were found during                            retroflexion. The hemorrhoids were small.                           The terminal ileum appeared normal.                           The exam was otherwise without abnormality on  direct and retroflexion views. Complications:            No immediate complications. Estimated Blood Loss:     Estimated blood loss: none. Impression:               -Minimal sigmoid diverticulosis.                           -Otherwise normal colonoscopy to TI. Recommendation:           - Patient has a contact number available for                            emergencies. The signs and symptoms of potential                            delayed complications were discussed with the                            patient. Return to normal activities tomorrow.                            Written discharge instructions were provided to the                            patient.                           - Resume previous diet.                           - Continue present medications.                           - Repeat colonoscopy in 10 years for screening                            purposes. Earlier, if with any new  problems or if                            there is any change in family history.                           - Return to GI clinic PRN.                           - The findings and recommendations were discussed                            with the patient's family. Jackquline Denmark, MD 12/21/2019 11:06:06 AM This report has been signed electronically.

## 2019-12-21 NOTE — Patient Instructions (Signed)
HANDOUTS PROVIDED ON: hemorrhoids and diverticulosis  Next colonoscopy in 10 years.   You may resume your previous diet and medication schedule.  Thank you for allowing [place] to care for you today!!!   YOU HAD AN ENDOSCOPIC PROCEDURE TODAY AT [facility]:   Refer to the procedure report that was given to you for any specific questions about what was found during the examination.  If the procedure report does not answer your questions, please call your gastroenterologist to clarify.  If you requested that your care partner not be given the details of your procedure findings, then the procedure report has been included in a sealed envelope for you to review at your convenience later.  YOU SHOULD EXPECT: Some feelings of bloating in the abdomen. Passage of more gas than usual.  Walking can help get rid of the air that was put into your GI tract during the procedure and reduce the bloating. If you had a lower endoscopy (such as a colonoscopy or flexible sigmoidoscopy) you may notice spotting of blood in your stool or on the toilet paper. If you underwent a bowel prep for your procedure, you may not have a normal bowel movement for a few days.  Please Note:  You might notice some irritation and congestion in your nose or some drainage.  This is from the oxygen used during your procedure.  There is no need for concern and it should clear up in a day or so.  SYMPTOMS TO REPORT IMMEDIATELY:   Following lower endoscopy (colonoscopy or flexible sigmoidoscopy):  Excessive amounts of blood in the stool  Significant tenderness or worsening of abdominal pains  Swelling of the abdomen that is new, acute  Fever of 100F or higher    For urgent or emergent issues, a gastroenterologist can be reached at any hour by calling [phone]. Do not use MyChart messaging for urgent concerns.    DIET:  We do recommend a small meal at first, but then you may proceed to your regular diet.  Drink  plenty of fluids but you should avoid alcoholic beverages for 24 hours.  ACTIVITY:  You should plan to take it easy for the rest of today and you should NOT DRIVE or use heavy machinery until tomorrow (because of the sedation medicines used during the test).    FOLLOW UP: Our staff will call the number listed on your records 48-72 hours following your procedure to check on you and address any questions or concerns that you may have regarding the information given to you following your procedure. If we do not reach you, we will leave a message.  We will attempt to reach you two times.  During this call, we will ask if you have developed any symptoms of COVID 19. If you develop any symptoms (ie: fever, flu-like symptoms, shortness of breath, cough etc.) before then, please call [phone].  If you test positive for Covid 19 in the 2 weeks post procedure, please call and report this information to [place].    If any biopsies were taken you will be contacted by phone or by letter within the next 1-3 weeks.  Please call us at [phone] if you have not heard about the biopsies in 3 weeks.    SIGNATURES/CONFIDENTIALITY: You and/or your care partner have signed paperwork which will be entered into your electronic medical record.  These signatures attest to the fact that that the information above on your After Visit Summary has been reviewed  and is understood.  Full responsibility of the confidentiality of this discharge information lies with you and/or your care-partner. 

## 2019-12-21 NOTE — Progress Notes (Signed)
PT taken to PACU. Monitors in place. VSS. Report given to RN. 

## 2019-12-21 NOTE — Progress Notes (Signed)
Vital signs checked by:CW  The patient states no changes in medical or surgical history since pre-visit screening on 11/26/19.

## 2019-12-23 ENCOUNTER — Telehealth: Payer: Self-pay

## 2019-12-23 NOTE — Telephone Encounter (Signed)
   Follow up Call-  Call back number 12/21/2019  Post procedure Call Back phone  # [phone]  Permission to leave phone message Yes  Some recent data might be hidden     Patient questions:  Do you have a fever, pain , or abdominal swelling? No. Pain Score  0 *  Have you tolerated food without any problems? Yes.    Have you been able to return to your normal activities? Yes.    Do you have any questions about your discharge instructions: Diet   No. Medications  No. Follow up visit  No.  Do you have questions or concerns about your Care? No.  Actions: * If pain score is 4 or above: No action needed, pain <4.  1. Have you developed a fever since your procedure? no  2.   Have you had an respiratory symptoms (SOB or cough) since your procedure? no  3.   Have you tested positive for COVID 19 since your procedure no  4.   Have you had any family members/close contacts diagnosed with the COVID 19 since your procedure?  no   If yes to any of these questions please route to Joylene John, RN and Joella Prince, RN

## 2020-01-11 DIAGNOSIS — H524 Presbyopia: Secondary | ICD-10-CM | POA: Diagnosis not present

## 2020-02-16 DIAGNOSIS — R42 Dizziness and giddiness: Secondary | ICD-10-CM | POA: Diagnosis not present

## 2020-02-16 DIAGNOSIS — R519 Headache, unspecified: Secondary | ICD-10-CM | POA: Diagnosis not present

## 2020-02-16 DIAGNOSIS — S0990XA Unspecified injury of head, initial encounter: Secondary | ICD-10-CM | POA: Diagnosis not present

## 2020-02-16 DIAGNOSIS — J0110 Acute frontal sinusitis, unspecified: Secondary | ICD-10-CM | POA: Diagnosis not present

## 2020-02-16 DIAGNOSIS — W19XXXA Unspecified fall, initial encounter: Secondary | ICD-10-CM | POA: Diagnosis not present

## 2020-02-16 DIAGNOSIS — X58XXXA Exposure to other specified factors, initial encounter: Secondary | ICD-10-CM | POA: Diagnosis not present

## 2020-04-07 DIAGNOSIS — G8929 Other chronic pain: Secondary | ICD-10-CM | POA: Diagnosis not present

## 2020-04-07 DIAGNOSIS — E039 Hypothyroidism, unspecified: Secondary | ICD-10-CM | POA: Diagnosis not present

## 2020-04-07 DIAGNOSIS — M1990 Unspecified osteoarthritis, unspecified site: Secondary | ICD-10-CM | POA: Diagnosis not present

## 2020-04-07 DIAGNOSIS — F419 Anxiety disorder, unspecified: Secondary | ICD-10-CM | POA: Diagnosis not present

## 2020-04-07 DIAGNOSIS — E663 Overweight: Secondary | ICD-10-CM | POA: Diagnosis not present

## 2020-04-07 DIAGNOSIS — E785 Hyperlipidemia, unspecified: Secondary | ICD-10-CM | POA: Diagnosis not present

## 2020-04-07 DIAGNOSIS — R030 Elevated blood-pressure reading, without diagnosis of hypertension: Secondary | ICD-10-CM | POA: Diagnosis not present

## 2020-04-07 DIAGNOSIS — R32 Unspecified urinary incontinence: Secondary | ICD-10-CM | POA: Diagnosis not present

## 2020-04-07 DIAGNOSIS — R69 Illness, unspecified: Secondary | ICD-10-CM | POA: Diagnosis not present

## 2020-04-07 DIAGNOSIS — M8580 Other specified disorders of bone density and structure, unspecified site: Secondary | ICD-10-CM | POA: Diagnosis not present

## 2020-08-02 DIAGNOSIS — R5383 Other fatigue: Secondary | ICD-10-CM | POA: Diagnosis not present

## 2020-08-02 DIAGNOSIS — M791 Myalgia, unspecified site: Secondary | ICD-10-CM | POA: Diagnosis not present

## 2020-08-02 DIAGNOSIS — E890 Postprocedural hypothyroidism: Secondary | ICD-10-CM | POA: Diagnosis not present

## 2020-10-11 DIAGNOSIS — Z1231 Encounter for screening mammogram for malignant neoplasm of breast: Secondary | ICD-10-CM | POA: Diagnosis not present

## 2020-10-11 DIAGNOSIS — Z1331 Encounter for screening for depression: Secondary | ICD-10-CM | POA: Diagnosis not present

## 2020-10-11 DIAGNOSIS — E890 Postprocedural hypothyroidism: Secondary | ICD-10-CM | POA: Diagnosis not present

## 2020-10-11 DIAGNOSIS — Z1339 Encounter for screening examination for other mental health and behavioral disorders: Secondary | ICD-10-CM | POA: Diagnosis not present

## 2020-10-11 DIAGNOSIS — Z0001 Encounter for general adult medical examination with abnormal findings: Secondary | ICD-10-CM | POA: Diagnosis not present

## 2020-10-11 DIAGNOSIS — E2839 Other primary ovarian failure: Secondary | ICD-10-CM | POA: Diagnosis not present

## 2020-10-11 DIAGNOSIS — N393 Stress incontinence (female) (male): Secondary | ICD-10-CM | POA: Diagnosis not present

## 2020-10-11 DIAGNOSIS — R69 Illness, unspecified: Secondary | ICD-10-CM | POA: Diagnosis not present

## 2020-10-19 DIAGNOSIS — R109 Unspecified abdominal pain: Secondary | ICD-10-CM | POA: Diagnosis not present

## 2020-10-19 DIAGNOSIS — R1031 Right lower quadrant pain: Secondary | ICD-10-CM | POA: Diagnosis not present

## 2020-10-19 DIAGNOSIS — Z7689 Persons encountering health services in other specified circumstances: Secondary | ICD-10-CM | POA: Diagnosis not present

## 2020-10-19 DIAGNOSIS — Z1272 Encounter for screening for malignant neoplasm of vagina: Secondary | ICD-10-CM | POA: Diagnosis not present

## 2020-11-02 DIAGNOSIS — M47817 Spondylosis without myelopathy or radiculopathy, lumbosacral region: Secondary | ICD-10-CM | POA: Diagnosis not present

## 2020-11-02 DIAGNOSIS — M47816 Spondylosis without myelopathy or radiculopathy, lumbar region: Secondary | ICD-10-CM | POA: Diagnosis not present

## 2020-11-02 DIAGNOSIS — R1031 Right lower quadrant pain: Secondary | ICD-10-CM | POA: Diagnosis not present

## 2020-11-02 DIAGNOSIS — R109 Unspecified abdominal pain: Secondary | ICD-10-CM | POA: Diagnosis not present

## 2020-11-02 DIAGNOSIS — Z90710 Acquired absence of both cervix and uterus: Secondary | ICD-10-CM | POA: Diagnosis not present

## 2020-11-17 DIAGNOSIS — E890 Postprocedural hypothyroidism: Secondary | ICD-10-CM | POA: Diagnosis not present

## 2020-11-28 DIAGNOSIS — N281 Cyst of kidney, acquired: Secondary | ICD-10-CM | POA: Diagnosis not present

## 2020-11-28 DIAGNOSIS — R93429 Abnormal radiologic findings on diagnostic imaging of unspecified kidney: Secondary | ICD-10-CM | POA: Diagnosis not present

## 2020-11-28 DIAGNOSIS — R160 Hepatomegaly, not elsewhere classified: Secondary | ICD-10-CM | POA: Diagnosis not present

## 2020-12-25 DIAGNOSIS — N281 Cyst of kidney, acquired: Secondary | ICD-10-CM | POA: Diagnosis not present

## 2020-12-25 DIAGNOSIS — M549 Dorsalgia, unspecified: Secondary | ICD-10-CM | POA: Diagnosis not present

## 2020-12-25 DIAGNOSIS — K760 Fatty (change of) liver, not elsewhere classified: Secondary | ICD-10-CM | POA: Diagnosis not present

## 2020-12-25 DIAGNOSIS — K8689 Other specified diseases of pancreas: Secondary | ICD-10-CM | POA: Diagnosis not present

## 2020-12-25 DIAGNOSIS — R7989 Other specified abnormal findings of blood chemistry: Secondary | ICD-10-CM | POA: Diagnosis not present

## 2021-01-08 DIAGNOSIS — K760 Fatty (change of) liver, not elsewhere classified: Secondary | ICD-10-CM | POA: Diagnosis not present

## 2021-01-08 DIAGNOSIS — E890 Postprocedural hypothyroidism: Secondary | ICD-10-CM | POA: Diagnosis not present

## 2021-01-08 DIAGNOSIS — M549 Dorsalgia, unspecified: Secondary | ICD-10-CM | POA: Diagnosis not present

## 2021-01-08 DIAGNOSIS — Z79899 Other long term (current) drug therapy: Secondary | ICD-10-CM | POA: Diagnosis not present

## 2021-02-07 DIAGNOSIS — M19012 Primary osteoarthritis, left shoulder: Secondary | ICD-10-CM | POA: Diagnosis not present

## 2021-02-13 DIAGNOSIS — Z1231 Encounter for screening mammogram for malignant neoplasm of breast: Secondary | ICD-10-CM | POA: Diagnosis not present

## 2021-02-13 DIAGNOSIS — M47814 Spondylosis without myelopathy or radiculopathy, thoracic region: Secondary | ICD-10-CM | POA: Diagnosis not present

## 2021-02-13 DIAGNOSIS — M545 Low back pain, unspecified: Secondary | ICD-10-CM | POA: Diagnosis not present

## 2021-03-21 DIAGNOSIS — M19012 Primary osteoarthritis, left shoulder: Secondary | ICD-10-CM | POA: Diagnosis not present

## 2021-03-21 DIAGNOSIS — M7552 Bursitis of left shoulder: Secondary | ICD-10-CM | POA: Diagnosis not present

## 2021-04-06 DIAGNOSIS — M8580 Other specified disorders of bone density and structure, unspecified site: Secondary | ICD-10-CM | POA: Diagnosis not present

## 2021-04-06 DIAGNOSIS — M85852 Other specified disorders of bone density and structure, left thigh: Secondary | ICD-10-CM | POA: Diagnosis not present

## 2021-04-06 DIAGNOSIS — E2839 Other primary ovarian failure: Secondary | ICD-10-CM | POA: Diagnosis not present

## 2021-04-13 DIAGNOSIS — M150 Primary generalized (osteo)arthritis: Secondary | ICD-10-CM | POA: Diagnosis not present

## 2021-04-13 DIAGNOSIS — Z6828 Body mass index (BMI) 28.0-28.9, adult: Secondary | ICD-10-CM | POA: Diagnosis not present

## 2021-04-13 DIAGNOSIS — E890 Postprocedural hypothyroidism: Secondary | ICD-10-CM | POA: Diagnosis not present

## 2021-04-29 DIAGNOSIS — B349 Viral infection, unspecified: Secondary | ICD-10-CM | POA: Diagnosis not present

## 2021-04-29 DIAGNOSIS — J028 Acute pharyngitis due to other specified organisms: Secondary | ICD-10-CM | POA: Diagnosis not present

## 2021-04-29 DIAGNOSIS — J029 Acute pharyngitis, unspecified: Secondary | ICD-10-CM | POA: Diagnosis not present

## 2021-06-20 DIAGNOSIS — M47816 Spondylosis without myelopathy or radiculopathy, lumbar region: Secondary | ICD-10-CM | POA: Diagnosis not present

## 2021-06-20 DIAGNOSIS — I10 Essential (primary) hypertension: Secondary | ICD-10-CM | POA: Diagnosis not present

## 2021-06-20 DIAGNOSIS — M542 Cervicalgia: Secondary | ICD-10-CM | POA: Diagnosis not present

## 2021-06-26 DIAGNOSIS — M4003 Postural kyphosis, cervicothoracic region: Secondary | ICD-10-CM | POA: Diagnosis not present

## 2021-06-26 DIAGNOSIS — M47816 Spondylosis without myelopathy or radiculopathy, lumbar region: Secondary | ICD-10-CM | POA: Diagnosis not present

## 2021-06-26 DIAGNOSIS — M5459 Other low back pain: Secondary | ICD-10-CM | POA: Diagnosis not present

## 2021-06-26 DIAGNOSIS — M542 Cervicalgia: Secondary | ICD-10-CM | POA: Diagnosis not present

## 2021-06-29 DIAGNOSIS — M542 Cervicalgia: Secondary | ICD-10-CM | POA: Diagnosis not present

## 2021-06-29 DIAGNOSIS — M5459 Other low back pain: Secondary | ICD-10-CM | POA: Diagnosis not present

## 2021-06-29 DIAGNOSIS — M47816 Spondylosis without myelopathy or radiculopathy, lumbar region: Secondary | ICD-10-CM | POA: Diagnosis not present

## 2021-06-29 DIAGNOSIS — M4003 Postural kyphosis, cervicothoracic region: Secondary | ICD-10-CM | POA: Diagnosis not present

## 2021-07-03 DIAGNOSIS — M5459 Other low back pain: Secondary | ICD-10-CM | POA: Diagnosis not present

## 2021-07-03 DIAGNOSIS — M47816 Spondylosis without myelopathy or radiculopathy, lumbar region: Secondary | ICD-10-CM | POA: Diagnosis not present

## 2021-07-03 DIAGNOSIS — M542 Cervicalgia: Secondary | ICD-10-CM | POA: Diagnosis not present

## 2021-07-03 DIAGNOSIS — M4003 Postural kyphosis, cervicothoracic region: Secondary | ICD-10-CM | POA: Diagnosis not present

## 2021-07-06 DIAGNOSIS — M47816 Spondylosis without myelopathy or radiculopathy, lumbar region: Secondary | ICD-10-CM | POA: Diagnosis not present

## 2021-07-06 DIAGNOSIS — M5459 Other low back pain: Secondary | ICD-10-CM | POA: Diagnosis not present

## 2021-07-06 DIAGNOSIS — M542 Cervicalgia: Secondary | ICD-10-CM | POA: Diagnosis not present

## 2021-07-06 DIAGNOSIS — M4003 Postural kyphosis, cervicothoracic region: Secondary | ICD-10-CM | POA: Diagnosis not present

## 2021-07-10 DIAGNOSIS — M5459 Other low back pain: Secondary | ICD-10-CM | POA: Diagnosis not present

## 2021-07-10 DIAGNOSIS — M542 Cervicalgia: Secondary | ICD-10-CM | POA: Diagnosis not present

## 2021-07-10 DIAGNOSIS — M47816 Spondylosis without myelopathy or radiculopathy, lumbar region: Secondary | ICD-10-CM | POA: Diagnosis not present

## 2021-07-10 DIAGNOSIS — M4003 Postural kyphosis, cervicothoracic region: Secondary | ICD-10-CM | POA: Diagnosis not present

## 2021-07-13 DIAGNOSIS — M5459 Other low back pain: Secondary | ICD-10-CM | POA: Diagnosis not present

## 2021-07-13 DIAGNOSIS — M47816 Spondylosis without myelopathy or radiculopathy, lumbar region: Secondary | ICD-10-CM | POA: Diagnosis not present

## 2021-07-13 DIAGNOSIS — M4003 Postural kyphosis, cervicothoracic region: Secondary | ICD-10-CM | POA: Diagnosis not present

## 2021-07-13 DIAGNOSIS — M542 Cervicalgia: Secondary | ICD-10-CM | POA: Diagnosis not present

## 2021-07-17 DIAGNOSIS — M5459 Other low back pain: Secondary | ICD-10-CM | POA: Diagnosis not present

## 2021-07-17 DIAGNOSIS — M47816 Spondylosis without myelopathy or radiculopathy, lumbar region: Secondary | ICD-10-CM | POA: Diagnosis not present

## 2021-07-17 DIAGNOSIS — M542 Cervicalgia: Secondary | ICD-10-CM | POA: Diagnosis not present

## 2021-07-17 DIAGNOSIS — M4003 Postural kyphosis, cervicothoracic region: Secondary | ICD-10-CM | POA: Diagnosis not present

## 2021-08-03 DIAGNOSIS — M5412 Radiculopathy, cervical region: Secondary | ICD-10-CM | POA: Diagnosis not present

## 2021-08-03 DIAGNOSIS — M542 Cervicalgia: Secondary | ICD-10-CM | POA: Diagnosis not present

## 2021-08-03 DIAGNOSIS — M47816 Spondylosis without myelopathy or radiculopathy, lumbar region: Secondary | ICD-10-CM | POA: Diagnosis not present

## 2021-08-03 DIAGNOSIS — I10 Essential (primary) hypertension: Secondary | ICD-10-CM | POA: Diagnosis not present

## 2021-08-20 DIAGNOSIS — M5412 Radiculopathy, cervical region: Secondary | ICD-10-CM | POA: Diagnosis not present

## 2021-08-20 DIAGNOSIS — M47812 Spondylosis without myelopathy or radiculopathy, cervical region: Secondary | ICD-10-CM | POA: Diagnosis not present

## 2021-08-20 DIAGNOSIS — M4312 Spondylolisthesis, cervical region: Secondary | ICD-10-CM | POA: Diagnosis not present

## 2021-08-24 DIAGNOSIS — M48062 Spinal stenosis, lumbar region with neurogenic claudication: Secondary | ICD-10-CM | POA: Diagnosis not present

## 2021-08-24 DIAGNOSIS — M5412 Radiculopathy, cervical region: Secondary | ICD-10-CM | POA: Diagnosis not present

## 2021-08-24 DIAGNOSIS — M47812 Spondylosis without myelopathy or radiculopathy, cervical region: Secondary | ICD-10-CM | POA: Diagnosis not present

## 2021-08-24 DIAGNOSIS — M47816 Spondylosis without myelopathy or radiculopathy, lumbar region: Secondary | ICD-10-CM | POA: Diagnosis not present

## 2021-08-28 DIAGNOSIS — M48062 Spinal stenosis, lumbar region with neurogenic claudication: Secondary | ICD-10-CM | POA: Diagnosis not present

## 2021-08-28 DIAGNOSIS — M4126 Other idiopathic scoliosis, lumbar region: Secondary | ICD-10-CM | POA: Diagnosis not present

## 2021-09-17 DIAGNOSIS — M5412 Radiculopathy, cervical region: Secondary | ICD-10-CM | POA: Diagnosis not present

## 2021-10-09 DIAGNOSIS — M5412 Radiculopathy, cervical region: Secondary | ICD-10-CM | POA: Diagnosis not present

## 2021-10-09 DIAGNOSIS — M47816 Spondylosis without myelopathy or radiculopathy, lumbar region: Secondary | ICD-10-CM | POA: Diagnosis not present

## 2021-11-01 DIAGNOSIS — R5383 Other fatigue: Secondary | ICD-10-CM | POA: Diagnosis not present

## 2021-11-01 DIAGNOSIS — E890 Postprocedural hypothyroidism: Secondary | ICD-10-CM | POA: Diagnosis not present

## 2021-11-01 DIAGNOSIS — E7800 Pure hypercholesterolemia, unspecified: Secondary | ICD-10-CM | POA: Diagnosis not present

## 2021-11-01 DIAGNOSIS — M5030 Other cervical disc degeneration, unspecified cervical region: Secondary | ICD-10-CM | POA: Diagnosis not present

## 2021-11-01 DIAGNOSIS — Z1331 Encounter for screening for depression: Secondary | ICD-10-CM | POA: Diagnosis not present

## 2021-11-01 DIAGNOSIS — Z7189 Other specified counseling: Secondary | ICD-10-CM | POA: Diagnosis not present

## 2021-11-01 DIAGNOSIS — Z6828 Body mass index (BMI) 28.0-28.9, adult: Secondary | ICD-10-CM | POA: Diagnosis not present

## 2021-11-01 DIAGNOSIS — Z0000 Encounter for general adult medical examination without abnormal findings: Secondary | ICD-10-CM | POA: Diagnosis not present

## 2021-11-08 DIAGNOSIS — M7552 Bursitis of left shoulder: Secondary | ICD-10-CM | POA: Diagnosis not present

## 2021-11-08 DIAGNOSIS — M5412 Radiculopathy, cervical region: Secondary | ICD-10-CM | POA: Diagnosis not present

## 2021-11-08 DIAGNOSIS — M47816 Spondylosis without myelopathy or radiculopathy, lumbar region: Secondary | ICD-10-CM | POA: Diagnosis not present

## 2021-11-08 DIAGNOSIS — G5603 Carpal tunnel syndrome, bilateral upper limbs: Secondary | ICD-10-CM | POA: Diagnosis not present

## 2021-11-12 DIAGNOSIS — M25532 Pain in left wrist: Secondary | ICD-10-CM | POA: Diagnosis not present

## 2021-11-12 DIAGNOSIS — G5603 Carpal tunnel syndrome, bilateral upper limbs: Secondary | ICD-10-CM | POA: Diagnosis not present

## 2021-11-16 DIAGNOSIS — G5602 Carpal tunnel syndrome, left upper limb: Secondary | ICD-10-CM | POA: Diagnosis not present

## 2021-12-13 DIAGNOSIS — G5602 Carpal tunnel syndrome, left upper limb: Secondary | ICD-10-CM | POA: Insufficient documentation

## 2021-12-13 DIAGNOSIS — M5030 Other cervical disc degeneration, unspecified cervical region: Secondary | ICD-10-CM | POA: Diagnosis not present

## 2021-12-13 DIAGNOSIS — M47812 Spondylosis without myelopathy or radiculopathy, cervical region: Secondary | ICD-10-CM | POA: Insufficient documentation

## 2021-12-25 DIAGNOSIS — M47812 Spondylosis without myelopathy or radiculopathy, cervical region: Secondary | ICD-10-CM | POA: Diagnosis not present

## 2021-12-25 DIAGNOSIS — G5602 Carpal tunnel syndrome, left upper limb: Secondary | ICD-10-CM | POA: Diagnosis not present

## 2021-12-31 DIAGNOSIS — Z0100 Encounter for examination of eyes and vision without abnormal findings: Secondary | ICD-10-CM | POA: Diagnosis not present

## 2021-12-31 DIAGNOSIS — H43813 Vitreous degeneration, bilateral: Secondary | ICD-10-CM | POA: Diagnosis not present

## 2022-01-22 DIAGNOSIS — E785 Hyperlipidemia, unspecified: Secondary | ICD-10-CM | POA: Diagnosis not present

## 2022-02-04 DIAGNOSIS — M19011 Primary osteoarthritis, right shoulder: Secondary | ICD-10-CM | POA: Diagnosis not present

## 2022-02-14 DIAGNOSIS — Z1231 Encounter for screening mammogram for malignant neoplasm of breast: Secondary | ICD-10-CM | POA: Diagnosis not present

## 2022-04-01 DIAGNOSIS — M19011 Primary osteoarthritis, right shoulder: Secondary | ICD-10-CM | POA: Diagnosis not present

## 2022-04-05 DIAGNOSIS — M19011 Primary osteoarthritis, right shoulder: Secondary | ICD-10-CM | POA: Diagnosis not present

## 2022-04-15 DIAGNOSIS — N390 Urinary tract infection, site not specified: Secondary | ICD-10-CM | POA: Diagnosis not present

## 2022-04-15 DIAGNOSIS — M549 Dorsalgia, unspecified: Secondary | ICD-10-CM | POA: Diagnosis not present

## 2022-05-06 DIAGNOSIS — M19011 Primary osteoarthritis, right shoulder: Secondary | ICD-10-CM | POA: Diagnosis not present

## 2022-05-17 DIAGNOSIS — M19011 Primary osteoarthritis, right shoulder: Secondary | ICD-10-CM | POA: Diagnosis not present

## 2022-06-06 DIAGNOSIS — Z803 Family history of malignant neoplasm of breast: Secondary | ICD-10-CM | POA: Diagnosis not present

## 2022-06-06 DIAGNOSIS — M8580 Other specified disorders of bone density and structure, unspecified site: Secondary | ICD-10-CM | POA: Diagnosis not present

## 2022-06-06 DIAGNOSIS — M1990 Unspecified osteoarthritis, unspecified site: Secondary | ICD-10-CM | POA: Diagnosis not present

## 2022-06-06 DIAGNOSIS — R32 Unspecified urinary incontinence: Secondary | ICD-10-CM | POA: Diagnosis not present

## 2022-06-06 DIAGNOSIS — Z881 Allergy status to other antibiotic agents status: Secondary | ICD-10-CM | POA: Diagnosis not present

## 2022-06-06 DIAGNOSIS — E890 Postprocedural hypothyroidism: Secondary | ICD-10-CM | POA: Diagnosis not present

## 2022-06-06 DIAGNOSIS — R69 Illness, unspecified: Secondary | ICD-10-CM | POA: Diagnosis not present

## 2022-08-04 DIAGNOSIS — W5501XA Bitten by cat, initial encounter: Secondary | ICD-10-CM | POA: Diagnosis not present

## 2022-08-04 DIAGNOSIS — S51851A Open bite of right forearm, initial encounter: Secondary | ICD-10-CM | POA: Diagnosis not present

## 2022-08-04 DIAGNOSIS — Z23 Encounter for immunization: Secondary | ICD-10-CM | POA: Diagnosis not present

## 2022-10-15 DIAGNOSIS — E785 Hyperlipidemia, unspecified: Secondary | ICD-10-CM | POA: Diagnosis not present

## 2022-10-15 DIAGNOSIS — E890 Postprocedural hypothyroidism: Secondary | ICD-10-CM | POA: Diagnosis not present

## 2022-10-15 DIAGNOSIS — R059 Cough, unspecified: Secondary | ICD-10-CM | POA: Diagnosis not present

## 2022-10-15 DIAGNOSIS — M25511 Pain in right shoulder: Secondary | ICD-10-CM | POA: Diagnosis not present

## 2022-10-15 DIAGNOSIS — F419 Anxiety disorder, unspecified: Secondary | ICD-10-CM | POA: Diagnosis not present

## 2022-10-15 DIAGNOSIS — H04123 Dry eye syndrome of bilateral lacrimal glands: Secondary | ICD-10-CM | POA: Diagnosis not present

## 2022-10-15 DIAGNOSIS — Z6825 Body mass index (BMI) 25.0-25.9, adult: Secondary | ICD-10-CM | POA: Diagnosis not present

## 2022-10-15 DIAGNOSIS — R490 Dysphonia: Secondary | ICD-10-CM | POA: Diagnosis not present

## 2022-10-15 DIAGNOSIS — R0602 Shortness of breath: Secondary | ICD-10-CM | POA: Diagnosis not present

## 2022-10-31 DIAGNOSIS — R918 Other nonspecific abnormal finding of lung field: Secondary | ICD-10-CM | POA: Diagnosis not present

## 2022-10-31 DIAGNOSIS — R059 Cough, unspecified: Secondary | ICD-10-CM | POA: Diagnosis not present

## 2022-10-31 DIAGNOSIS — J9811 Atelectasis: Secondary | ICD-10-CM | POA: Diagnosis not present

## 2022-11-06 DIAGNOSIS — G43109 Migraine with aura, not intractable, without status migrainosus: Secondary | ICD-10-CM | POA: Diagnosis not present

## 2022-11-11 DIAGNOSIS — R12 Heartburn: Secondary | ICD-10-CM | POA: Diagnosis not present

## 2022-11-11 DIAGNOSIS — R059 Cough, unspecified: Secondary | ICD-10-CM | POA: Diagnosis not present

## 2022-11-11 DIAGNOSIS — R1310 Dysphagia, unspecified: Secondary | ICD-10-CM | POA: Diagnosis not present

## 2022-11-11 DIAGNOSIS — R0600 Dyspnea, unspecified: Secondary | ICD-10-CM | POA: Diagnosis not present

## 2022-12-24 DIAGNOSIS — R1310 Dysphagia, unspecified: Secondary | ICD-10-CM | POA: Diagnosis not present

## 2023-03-03 DIAGNOSIS — M79671 Pain in right foot: Secondary | ICD-10-CM | POA: Diagnosis not present

## 2023-03-03 DIAGNOSIS — M21611 Bunion of right foot: Secondary | ICD-10-CM | POA: Diagnosis not present

## 2023-03-03 DIAGNOSIS — J019 Acute sinusitis, unspecified: Secondary | ICD-10-CM | POA: Diagnosis not present

## 2023-03-03 DIAGNOSIS — Z6825 Body mass index (BMI) 25.0-25.9, adult: Secondary | ICD-10-CM | POA: Diagnosis not present

## 2023-03-04 DIAGNOSIS — Z1231 Encounter for screening mammogram for malignant neoplasm of breast: Secondary | ICD-10-CM | POA: Diagnosis not present

## 2023-04-10 DIAGNOSIS — Z6825 Body mass index (BMI) 25.0-25.9, adult: Secondary | ICD-10-CM | POA: Diagnosis not present

## 2023-04-10 DIAGNOSIS — M542 Cervicalgia: Secondary | ICD-10-CM | POA: Diagnosis not present

## 2023-04-17 DIAGNOSIS — M2021 Hallux rigidus, right foot: Secondary | ICD-10-CM | POA: Diagnosis not present

## 2023-04-17 DIAGNOSIS — M2011 Hallux valgus (acquired), right foot: Secondary | ICD-10-CM | POA: Diagnosis not present

## 2023-04-17 DIAGNOSIS — M7741 Metatarsalgia, right foot: Secondary | ICD-10-CM | POA: Diagnosis not present

## 2023-04-17 DIAGNOSIS — M25374 Other instability, right foot: Secondary | ICD-10-CM | POA: Diagnosis not present

## 2023-05-27 DIAGNOSIS — M25774 Osteophyte, right foot: Secondary | ICD-10-CM | POA: Diagnosis not present

## 2023-05-27 DIAGNOSIS — G8918 Other acute postprocedural pain: Secondary | ICD-10-CM | POA: Diagnosis not present

## 2023-05-27 DIAGNOSIS — M7741 Metatarsalgia, right foot: Secondary | ICD-10-CM | POA: Diagnosis not present

## 2023-05-27 DIAGNOSIS — M2021 Hallux rigidus, right foot: Secondary | ICD-10-CM | POA: Diagnosis not present

## 2023-05-27 DIAGNOSIS — T8484XA Pain due to internal orthopedic prosthetic devices, implants and grafts, initial encounter: Secondary | ICD-10-CM | POA: Diagnosis not present

## 2023-07-10 DIAGNOSIS — M25374 Other instability, right foot: Secondary | ICD-10-CM | POA: Diagnosis not present

## 2023-07-10 DIAGNOSIS — M7741 Metatarsalgia, right foot: Secondary | ICD-10-CM | POA: Diagnosis not present

## 2023-07-10 DIAGNOSIS — M2011 Hallux valgus (acquired), right foot: Secondary | ICD-10-CM | POA: Diagnosis not present

## 2023-07-10 DIAGNOSIS — Z4789 Encounter for other orthopedic aftercare: Secondary | ICD-10-CM | POA: Diagnosis not present

## 2023-07-10 DIAGNOSIS — M2021 Hallux rigidus, right foot: Secondary | ICD-10-CM | POA: Diagnosis not present

## 2023-08-14 DIAGNOSIS — M79672 Pain in left foot: Secondary | ICD-10-CM | POA: Diagnosis not present

## 2023-08-14 DIAGNOSIS — M2021 Hallux rigidus, right foot: Secondary | ICD-10-CM | POA: Diagnosis not present

## 2023-09-08 DIAGNOSIS — Z01818 Encounter for other preprocedural examination: Secondary | ICD-10-CM | POA: Diagnosis not present

## 2023-09-08 DIAGNOSIS — H2513 Age-related nuclear cataract, bilateral: Secondary | ICD-10-CM | POA: Diagnosis not present

## 2023-09-08 DIAGNOSIS — H52221 Regular astigmatism, right eye: Secondary | ICD-10-CM | POA: Diagnosis not present

## 2023-09-08 DIAGNOSIS — H2511 Age-related nuclear cataract, right eye: Secondary | ICD-10-CM | POA: Diagnosis not present

## 2023-09-15 DIAGNOSIS — H52201 Unspecified astigmatism, right eye: Secondary | ICD-10-CM | POA: Diagnosis not present

## 2023-09-15 DIAGNOSIS — H2511 Age-related nuclear cataract, right eye: Secondary | ICD-10-CM | POA: Diagnosis not present

## 2023-09-22 DIAGNOSIS — H2512 Age-related nuclear cataract, left eye: Secondary | ICD-10-CM | POA: Diagnosis not present

## 2023-09-22 DIAGNOSIS — H269 Unspecified cataract: Secondary | ICD-10-CM | POA: Diagnosis not present

## 2023-09-22 DIAGNOSIS — H52202 Unspecified astigmatism, left eye: Secondary | ICD-10-CM | POA: Diagnosis not present

## 2023-10-01 DIAGNOSIS — H43812 Vitreous degeneration, left eye: Secondary | ICD-10-CM | POA: Diagnosis not present

## 2023-12-24 DIAGNOSIS — E7800 Pure hypercholesterolemia, unspecified: Secondary | ICD-10-CM | POA: Diagnosis not present

## 2023-12-24 DIAGNOSIS — M542 Cervicalgia: Secondary | ICD-10-CM | POA: Diagnosis not present

## 2023-12-24 DIAGNOSIS — E890 Postprocedural hypothyroidism: Secondary | ICD-10-CM | POA: Diagnosis not present

## 2023-12-24 DIAGNOSIS — R079 Chest pain, unspecified: Secondary | ICD-10-CM | POA: Diagnosis not present

## 2023-12-24 DIAGNOSIS — R42 Dizziness and giddiness: Secondary | ICD-10-CM | POA: Diagnosis not present

## 2023-12-24 DIAGNOSIS — Z6825 Body mass index (BMI) 25.0-25.9, adult: Secondary | ICD-10-CM | POA: Diagnosis not present

## 2024-01-05 ENCOUNTER — Encounter: Payer: Self-pay | Admitting: *Deleted

## 2024-01-07 ENCOUNTER — Ambulatory Visit

## 2024-01-07 ENCOUNTER — Encounter: Payer: Self-pay | Admitting: Cardiology

## 2024-01-07 ENCOUNTER — Ambulatory Visit: Payer: Self-pay | Attending: Cardiology | Admitting: Cardiology

## 2024-01-07 VITALS — BP 152/72 | Ht 64.0 in | Wt 148.0 lb

## 2024-01-07 DIAGNOSIS — Z136 Encounter for screening for cardiovascular disorders: Secondary | ICD-10-CM | POA: Diagnosis not present

## 2024-01-07 DIAGNOSIS — R072 Precordial pain: Secondary | ICD-10-CM

## 2024-01-07 DIAGNOSIS — R42 Dizziness and giddiness: Secondary | ICD-10-CM | POA: Diagnosis not present

## 2024-01-07 DIAGNOSIS — R0789 Other chest pain: Secondary | ICD-10-CM | POA: Diagnosis not present

## 2024-01-07 DIAGNOSIS — R002 Palpitations: Secondary | ICD-10-CM | POA: Diagnosis not present

## 2024-01-07 DIAGNOSIS — R0609 Other forms of dyspnea: Secondary | ICD-10-CM | POA: Insufficient documentation

## 2024-01-07 MED ORDER — METOPROLOL TARTRATE 100 MG PO TABS: ORAL_TABLET | ORAL | 0 refills | Status: DC

## 2024-01-07 MED ORDER — PREDNISONE 50 MG PO TABS: ORAL_TABLET | ORAL | 0 refills | Status: DC

## 2024-01-07 NOTE — Patient Instructions (Signed)
 Medication Instructions:   TAKE: Metoprolol 50mg  1 tablet 2 hours prior to CT scan   Lab Work: None Ordered If you have labs (blood work) drawn today and your tests are completely normal, you will receive your results only by: MyChart Message (if you have MyChart) OR A paper copy in the mail If you have any lab test that is abnormal or we need to change your treatment, we will call you to review the results.   Testing/Procedures:   WHY IS MY DOCTOR PRESCRIBING ZIO? The Zio system is proven and trusted by physicians to detect and diagnose irregular heart rhythms -- and has been prescribed to hundreds of thousands of patients.  The FDA has cleared the Zio system to monitor for many different kinds of irregular heart rhythms. In a study, physicians were able to reach a diagnosis 90% of the time with the Zio system1.  You can wear the Zio monitor -- a small, discreet, comfortable patch -- during your normal day-to-day activity, including while you sleep, shower, and exercise, while it records every single heartbeat for analysis.  1Barrett, P., et al. Comparison of 24 Hour Holter Monitoring Versus 14 Day Novel Adhesive Patch Electrocardiographic Monitoring. American Journal of Medicine, 2014.  ZIO VS. HOLTER MONITORING The Zio monitor can be comfortably worn for up to 14 days. Holter monitors can be worn for 24 to 48 hours, limiting the time to record any irregular heart rhythms you may have. Zio is able to capture data for the 51% of patients who have their first symptom-triggered arrhythmia after 48 hours.1  LIVE WITHOUT RESTRICTIONS The Zio ambulatory cardiac monitor is a small, unobtrusive, and water -resistant patch--you might even forget you're wearing it. The Zio monitor records and stores every beat of your heart, whether you're sleeping, working out, or showering.    Your physician has requested that you have an echocardiogram. Echocardiography is a painless test that uses sound  waves to create images of your heart. It provides your doctor with information about the size and shape of your heart and how well your heart's chambers and valves are working. This procedure takes approximately one hour. There are no restrictions for this procedure. Please do NOT wear cologne, perfume, aftershave, or lotions (deodorant is allowed). Please arrive 15 minutes prior to your appointment time.  Please note: We ask at that you not bring children with you during ultrasound (echo/ vascular) testing. Due to room size and safety concerns, children are not allowed in the ultrasound rooms during exams. Our front office staff cannot provide observation of children in our lobby area while testing is being conducted. An adult accompanying a patient to their appointment will only be allowed in the ultrasound room at the discretion of the ultrasound technician under special circumstances. We apologize for any inconvenience.   Your cardiac CT will be scheduled at one of the below locations:   [facility] [address] [place], [place] [address] [phone]  Please follow these instructions carefully (unless otherwise directed):   On the Night Before the Test: Be sure to Drink plenty of water . Do not consume any caffeinated/decaffeinated beverages or chocolate 12 hours prior to your test. Do not take any antihistamines 12 hours prior to your test. If the patient has contrast allergy: Patient will need a prescription for Prednisone and very clear instructions (as follows): Prednisone 50 mg - take 13 hours prior to test Take another Prednisone 50 mg 7 hours prior to test Take another Prednisone 50 mg 1  hour prior to test Take Benadryl  50 mg 1 hour prior to test Patient must complete all four doses of above prophylactic medications. Patient will need a ride after test due to Benadryl .  On the Day of the Test: Drink plenty of water  until 1 hour prior to the test. Do not eat any food 4  hours prior to the test. You may take your regular medications prior to the test.  Take metoprolol (Lopressor) two hours prior to test. FEMALES- please wear underwire-free bra if available, avoid dresses & tight clothing       After the Test: Drink plenty of water . After receiving IV contrast, you may experience a mild flushed feeling. This is normal. On occasion, you may experience a mild rash up to 24 hours after the test. This is not dangerous. If this occurs, you can take Benadryl  25 mg and increase your fluid intake. If you experience trouble breathing, this can be serious. If it is severe call 911 IMMEDIATELY. If it is mild, please call our office. If you take any of these medications: Glipizide/Metformin, Avandament, Glucavance, please do not take 48 hours after completing test unless otherwise instructed.  We will call to schedule your test 2-4 weeks out understanding that some insurance companies will need an authorization prior to the service being performed.   For non-scheduling related questions, please contact the cardiac imaging nurse navigator should you have any questions/concerns: Camie Shutter, Cardiac Imaging Nurse Navigator Chantal Requena, Cardiac Imaging Nurse Navigator [facility] Direct Office Dial: [phone]   For scheduling needs, including cancellations and rescheduling, please call Brittany, [phone].    Follow-Up: At [facility], you and your health needs are our priority.  As part of our continuing mission to provide you with exceptional heart care, we have created designated Provider Care Teams.  These Care Teams include your primary Cardiologist (physician) and Advanced Practice Providers (APPs -  Physician Assistants and Nurse Practitioners) who all work together to provide you with the care you need, when you need it.  We recommend signing up for the patient portal called MyChart.  Sign up information is provided on  this After Visit Summary.  MyChart is used to connect with patients for Virtual Visits (Telemedicine).  Patients are able to view lab/test results, encounter notes, upcoming appointments, etc.  Non-urgent messages can be sent to your provider as well.   To learn more about what you can do with MyChart, go to [email or web address].    Your next appointment:   2 month(s)  The format for your next appointment:   In Person  Provider:   Lamar Fitch, MD    Other Instructions NA

## 2024-01-07 NOTE — Progress Notes (Signed)
 Cardiology Consultation:    Date:  01/07/2024   ID:  BREANAH FADDIS, DOB [birth year], MRN [id number]  PCP:  Jefferey Fitch, MD  Cardiologist:  Lamar Fitch, MD   Referring MD: Jefferey Fitch, MD   Chief Complaint  Patient presents with   Chest Pain    History of Present Illness:    Michele Glenn is a 68 y.o. female who is being seen today for the evaluation of having chest pain at the request of Jefferey Fitch, MD. past medical history significant for some sort of neck problem, anxiety, gastroesophageal reflux disease, fibromyalgia she was referred to us  because of constellation of symptoms.  First of all she complained of having dizziness she says when she walks she feels unsteady.  She never passed out there was situation however that she had to hold to something not to fall down.  Denies have any palpitation during those episodes.  Another complaint she has is a chest pain quite interesting story she said the pain can last her for a long time sometimes hours not related to exercise she graded sensation is 5 and scale up to 10 just said it feels like her heavy cart is sitting on her chest.  There is no shortness of breath no sweating associated with this sensation, there is no aggravating or relieving factors.  She also complained of having some shortness of breath with the last couple days she decided to start doing some exercise she gets short of breath quite easily.  She never smoked does have dyslipidemia I do have dated from 2023 with LDL of 112 however she send recent cholesterol show LDL of 130, she does have family history of coronary artery disease actually I had an honor of taking care of her mother couple years ago.  She did require pacemaker  Past Medical History:  Diagnosis Date   Acute neck pain    bulging disk C4 through T1   Anxiety    Arthritis    Bilateral swelling of feet    Cancer (HCC) 04-2013   small amt cancer in thyroid      Thyroidectomy  04/2013    Complication of anesthesia    DDD (degenerative disc disease), cervical    Dry eyes    Fatigue    Fibromyalgia    GERD (gastroesophageal reflux disease)    Headache(784.0)    Heart murmur    HX OF   Heel spur 10/02/2010   Muscle pain    OA (osteoarthritis) of knee 10/25/2013   Osteoporosis    osteopenia   Papillary thyroid  carcinoma (HCC) 11/29/2017   Paresthesia 04/30/2013   Plantar fasciitis 10/02/2010   PONV (postoperative nausea and vomiting)    Postsurgical hypothyroidism 04/30/2013   Thyroid  disease     Past Surgical History:  Procedure Laterality Date   ABDOMINAL HYSTERECTOMY     1 ovary remained   BUNIONECTOMY     bilateral   CARPAL TUNNEL REPAIR Right 1999   KNEE SURGERY     right knee meniscus tear   TOTAL KNEE ARTHROPLASTY Left 10/25/2013   Procedure: LEFT TOTAL KNEE ARTHROPLASTY;  Surgeon: Dempsey Melodi GAILS, MD;  Location: [facility];  Service: Orthopedics;  Laterality: Left;   TOTAL KNEE ARTHROPLASTY Right 02/09/2014   Procedure: RIGHT TOTAL KNEE ARTHROPLASTY;  Surgeon: Dempsey Melodi GAILS, MD;  Location: [facility];  Service: Orthopedics;  Laterality: Right;   TOTAL THYROIDECTOMY  04/06/2013    Current Medications: Current Meds  Medication Sig   Ascorbic  Acid (VITAMIN C) 1000 MG tablet 1,000 mg. TAKE BY MOUTH TWO TO THREE TIMES A WEEK   aspirin EC 81 MG tablet Take 81 mg by mouth daily.   CALCIUM PO Take by mouth.   Cholecalciferol (VITAMIN D3) 125 MCG (5000 UT) CAPS Take by mouth.   Cyanocobalamin (VITAMIN B 12 PO) Take by mouth.   Fish Oil-Cholecalciferol (OMEGA-3 + D PO) Take by mouth.   Hawthorn Berry 565 MG CAPS Take by mouth.   levothyroxine  (SYNTHROID ) 88 MCG tablet Take 88 mcg by mouth daily.   MAGNESIUM PO Take by mouth.   Multiple Minerals-Vitamins (CALCIUM-MAGNESIUM-ZINC-D3) TABS Take by mouth.   sertraline  (ZOLOFT ) 25 MG tablet Take 25 mg by mouth daily.   traMADol  (ULTRAM ) 50 MG tablet Take 1-2 tablets (50-100 mg total) by mouth every 6 (six) hours as needed  (mild pain).   Turmeric 500 MG CAPS Take by mouth.     Allergies:   Iodine-131, Ivp dye [iodinated contrast media], Ketek [telithromycin], and Morphine  and codeine   Social History   Socioeconomic History   Marital status: Married    Spouse name: Not on file   Number of children: Not on file   Years of education: Not on file   Highest education level: Not on file  Occupational History   Occupation: Retired-[employer]  Tobacco Use   Smoking status: Never   Smokeless tobacco: Never  Vaping Use   Vaping status: Never Used  Substance and Sexual Activity   Alcohol  use: No   Drug use: No   Sexual activity: Not on file  Other Topics Concern   Not on file  Social History Narrative   Not on file   Social Drivers of Health   Financial Resource Strain: Not on file  Food Insecurity: Not on file  Transportation Needs: Not on file  Physical Activity: Not on file  Stress: Not on file  Social Connections: Not on file     Family History: The patient's family history includes AAA (abdominal aortic aneurysm) in her father; Colon polyps in her brother; Hypothyroidism in her mother. There is no history of Colon cancer, Esophageal cancer, Stomach cancer, or Rectal cancer. ROS:   Please see the history of present illness.    All 14 point review of systems negative except as described per history of present illness.  EKGs/Labs/Other Studies Reviewed:    The following studies were reviewed today:   EKG:  EKG Interpretation Date/Time:  Wednesday January 07 2024 08:42:11 EST Ventricular Rate:  62 PR Interval:  150 QRS Duration:  78 QT Interval:  394 QTC Calculation: 399 R Axis:   54  Text Interpretation: Normal sinus rhythm Septal infarct , age undetermined No previous ECGs available Confirmed by Bernie Charleston [phone]) on 01/07/2024 8:49:14 AM    Recent Labs: No results found for requested labs within last 365 days.  Recent Lipid Panel No results found for: CHOL, TRIG,  HDL, CHOLHDL, VLDL, LDLCALC, LDLDIRECT  Physical Exam:    VS:  BP (!) 152/72   Ht 5' 4 (1.626 m)   Wt 148 lb (67.1 kg)   SpO2 99%   BMI 25.40 kg/m     Wt Readings from Last 3 Encounters:  01/07/24 148 lb (67.1 kg)  12/21/19 161 lb (73 kg)  11/26/19 161 lb (73 kg)     GEN:  Well nourished, well developed in no acute distress HEENT: Normal NECK: No JVD; No carotid bruits LYMPHATICS: No lymphadenopathy CARDIAC: RRR, no murmurs, no rubs,  no gallops RESPIRATORY:  Clear to auscultation without rales, wheezing or rhonchi  ABDOMEN: Soft, non-tender, non-distended MUSCULOSKELETAL:  No edema; No deformity  SKIN: Warm and dry NEUROLOGIC:  Alert and oriented x 3 PSYCHIATRIC:  Normal affect   ASSESSMENT:    1. Screening for cardiovascular condition   2. Atypical chest pain   3. Dyspnea on exertion   4. Dizziness    PLAN:    In order of problems listed above:  Atypical chest pain.  I ask him to start taking 1 baby aspirin every single day we had a long discussion about approach to this problem I think the best test for her would be coronary CT angio.  Explained procedure to her we will proceed. Dyspnea on exertion multifactorial I suspect deconditioning may play some role here, we will schedule her to have echocardiogram to assess left ventricle ejection fraction. Dizziness also fairly atypical for potential arrhythmia but for completion Zio patch will be placed. Dyslipidemia will wait for results of her test before we commit her to cholesterol medication. We did start conversation about healthy lifestyle need to exercise on the regular basis.   Medication Adjustments/Labs and Tests Ordered: Current medicines are reviewed at length with the patient today.  Concerns regarding medicines are outlined above.  Orders Placed This Encounter  Procedures   EKG 12-Lead   No orders of the defined types were placed in this encounter.   Signed, Lamar DOROTHA Fitch, MD,  [facility]. 01/07/2024 9:08 AM    [facility]

## 2024-01-07 NOTE — Addendum Note (Signed)
 Addended by: ARLOA PLANAS D on: 01/07/2024 09:41 AM   Modules accepted: Orders

## 2024-01-27 DIAGNOSIS — R002 Palpitations: Secondary | ICD-10-CM | POA: Diagnosis not present

## 2024-02-04 ENCOUNTER — Ambulatory Visit: Attending: Cardiology

## 2024-02-04 DIAGNOSIS — R0609 Other forms of dyspnea: Secondary | ICD-10-CM | POA: Diagnosis not present

## 2024-02-04 LAB — ECHOCARDIOGRAM COMPLETE
AR max vel: 2.1 cm2
AV Area VTI: 1.99 cm2
AV Area mean vel: 2 cm2
AV Mean grad: 4 mmHg
AV Peak grad: 6.8 mmHg
Ao pk vel: 1.31 m/s
Area-P 1/2: 2.45 cm2
MV M vel: 2.91 m/s
MV Peak grad: 33.9 mmHg
MV VTI: 0.87 cm2
S' Lateral: 2.5 cm

## 2024-02-09 ENCOUNTER — Ambulatory Visit: Payer: Self-pay | Admitting: Cardiology

## 2024-02-09 DIAGNOSIS — R002 Palpitations: Secondary | ICD-10-CM

## 2024-02-11 ENCOUNTER — Telehealth (HOSPITAL_COMMUNITY): Payer: Self-pay | Admitting: *Deleted

## 2024-02-11 NOTE — Telephone Encounter (Signed)
 Reaching out to patient to offer assistance regarding upcoming cardiac imaging study; pt verbalizes understanding of appt date/time, parking situation and where to check in, pre-test NPO status and medications ordered, and verified current allergies; name and call back number provided for further questions should they arise Michele Frame RN Navigator Cardiac Imaging [facility] [phone] office [phone] cell  Patient verbalized understanding of allergy prep.

## 2024-02-12 ENCOUNTER — Inpatient Hospital Stay (INDEPENDENT_AMBULATORY_CARE_PROVIDER_SITE_OTHER): Admission: RE | Admit: 2024-02-12 | Discharge: 2024-02-12 | Attending: Cardiology | Admitting: Cardiology

## 2024-02-12 DIAGNOSIS — R072 Precordial pain: Secondary | ICD-10-CM | POA: Diagnosis not present

## 2024-02-12 MED ORDER — IOHEXOL 350 MG/ML SOLN
95.0000 mL | Freq: Once | INTRAVENOUS | Status: AC | PRN
  Administered 2024-02-12: 95 mL via INTRAVENOUS

## 2024-02-12 MED ORDER — NITROGLYCERIN 0.4 MG SL SUBL
0.8000 mg | SUBLINGUAL_TABLET | Freq: Once | SUBLINGUAL | Status: AC
  Administered 2024-02-12: 0.8 mg via SUBLINGUAL

## 2024-02-13 ENCOUNTER — Telehealth: Payer: Self-pay

## 2024-02-13 NOTE — Telephone Encounter (Signed)
 LVM per DPR- per Dr. Vanetta Shawl note regarding normal Echo results. Encouraged to call with any questions. Routed to PCP.

## 2024-02-23 ENCOUNTER — Encounter: Payer: Self-pay | Admitting: *Deleted

## 2024-03-08 ENCOUNTER — Encounter: Payer: Self-pay | Admitting: Cardiology

## 2024-03-08 ENCOUNTER — Ambulatory Visit: Attending: Cardiology | Admitting: Cardiology

## 2024-03-08 VITALS — BP 150/80 | HR 63 | Ht 64.0 in | Wt 150.0 lb

## 2024-03-08 DIAGNOSIS — R0609 Other forms of dyspnea: Secondary | ICD-10-CM

## 2024-03-08 DIAGNOSIS — I2510 Atherosclerotic heart disease of native coronary artery without angina pectoris: Secondary | ICD-10-CM | POA: Insufficient documentation

## 2024-03-08 DIAGNOSIS — R42 Dizziness and giddiness: Secondary | ICD-10-CM | POA: Diagnosis not present

## 2024-03-08 NOTE — Patient Instructions (Signed)
 Medication Instructions:  Your physician recommends that you continue on your current medications as directed. Please refer to the Current Medication list given to you today.  *If you need a refill on your cardiac medications before your next appointment, please call your pharmacy*   Lab Work: Your physician recommends that you return for lab work in: 3 mo You need to have labs done when you are fasting.  You can come Monday through Friday 8:30 am to 12:00 pm and 1:15 to 4:30. You do not need to make an appointment as the order has already been placed.     Testing/Procedures: None Ordered   Follow-Up: At [facility], you and your health needs are our priority.  As part of our continuing mission to provide you with exceptional heart care, we have created designated Provider Care Teams.  These Care Teams include your primary Cardiologist (physician) and Advanced Practice Providers (APPs -  Physician Assistants and Nurse Practitioners) who all work together to provide you with the care you need, when you need it.  We recommend signing up for the patient portal called MyChart.  Sign up information is provided on this After Visit Summary.  MyChart is used to connect with patients for Virtual Visits (Telemedicine).  Patients are able to view lab/test results, encounter notes, upcoming appointments, etc.  Non-urgent messages can be sent to your provider as well.   To learn more about what you can do with MyChart, go to [email or web address].    Your next appointment:   6 month(s)  The format for your next appointment:   In Person  Provider:   Lamar Fitch, MD    Other Instructions NA

## 2024-03-08 NOTE — Progress Notes (Signed)
 " Cardiology Office Note:    Date:  03/08/2024   ID:  Michele Glenn, DOB Dec 25, 1955, MRN [id number]  PCP:  Jefferey Fitch, MD  Cardiologist:  Lamar Fitch, MD    Referring MD: Jefferey Fitch, MD   Chief Complaint  Patient presents with   Follow-up  Doing fine  History of Present Illness:    Michele Glenn is a 69 y.o. female past medical history significant for some neck issue, anxiety, gastroesophageal reflux disease, fibromyalgia.  She came to us  because of atypical chest pain dizziness as well as dyspnea on exertion, a lot of test has been done echocardiogram showed mild mitral regurgitation normal ejection fraction, coronary CT angio revealed only mild disease 25 to 49% closer to 25% of proximal LAD calcium score 0, she also wore a monitor which shows some asymptomatic short episode of supraventricular tachycardia.  Comes today to talk about it.  Overall she is doing much better.  She said since that time she start taking baby aspirin every day she is doing well.  No chest pain tightness squeezing pressure burning chest.  She was to walk a lot but does have some problem with her right foot.  Past Medical History:  Diagnosis Date   Acute neck pain    bulging disk C4 through T1   Anxiety    Arthritis    Bilateral swelling of feet    Cancer (HCC) 04-2013   small amt cancer in thyroid      Thyroidectomy  04/2013   Complication of anesthesia    DDD (degenerative disc disease), cervical    Dry eyes    Fatigue    Fibromyalgia    GERD (gastroesophageal reflux disease)    Headache(784.0)    Heart murmur    HX OF   Heel spur 10/02/2010   Muscle pain    OA (osteoarthritis) of knee 10/25/2013   Osteoporosis    osteopenia   Papillary thyroid  carcinoma (HCC) 11/29/2017   Paresthesia 04/30/2013   Plantar fasciitis 10/02/2010   PONV (postoperative nausea and vomiting)    Postsurgical hypothyroidism 04/30/2013   Thyroid  disease     Past Surgical History:  Procedure Laterality Date    ABDOMINAL HYSTERECTOMY     1 ovary remained   BUNIONECTOMY     bilateral   CARPAL TUNNEL REPAIR Right 1999   KNEE SURGERY     right knee meniscus tear   TOTAL KNEE ARTHROPLASTY Left 10/25/2013   Procedure: LEFT TOTAL KNEE ARTHROPLASTY;  Surgeon: Dempsey Melodi GAILS, MD;  Location: [facility];  Service: Orthopedics;  Laterality: Left;   TOTAL KNEE ARTHROPLASTY Right 02/09/2014   Procedure: RIGHT TOTAL KNEE ARTHROPLASTY;  Surgeon: Dempsey Melodi GAILS, MD;  Location: [facility];  Service: Orthopedics;  Laterality: Right;   TOTAL THYROIDECTOMY  04/06/2013    Current Medications: Active Medications[1]   Allergies:   Iodine-131, Ivp dye [iodinated contrast media], Ketek [telithromycin], and Morphine  and codeine   Social History   Socioeconomic History   Marital status: Married    Spouse name: Not on file   Number of children: Not on file   Years of education: Not on file   Highest education level: Not on file  Occupational History   Occupation: Retired-[employer]  Tobacco Use   Smoking status: Never   Smokeless tobacco: Never  Vaping Use   Vaping status: Never Used  Substance and Sexual Activity   Alcohol  use: No   Drug use: No   Sexual activity: Not on file  Other Topics Concern   Not on file  Social History Narrative   Not on file   Social Drivers of Health   Tobacco Use: Low Risk (03/08/2024)   Patient History    Smoking Tobacco Use: Never    Smokeless Tobacco Use: Never    Passive Exposure: Not on file  Financial Resource Strain: Not on file  Food Insecurity: Not on file  Transportation Needs: Not on file  Physical Activity: Not on file  Stress: Not on file  Social Connections: Not on file  Depression (EYV7-0): Not on file  Alcohol  Screen: Not on file  Housing: Not on file  Utilities: Not on file  Health Literacy: Not on file     Family History: The patient's family history includes AAA (abdominal aortic aneurysm) in her father; Colon polyps in her brother; Hypothyroidism  in her mother. There is no history of Colon cancer, Esophageal cancer, Stomach cancer, or Rectal cancer. ROS:   Please see the history of present illness.    All 14 point review of systems negative except as described per history of present illness  EKGs/Labs/Other Studies Reviewed:         Recent Labs: No results found for requested labs within last 365 days.  Recent Lipid Panel No results found for: CHOL, TRIG, HDL, CHOLHDL, VLDL, LDLCALC, LDLDIRECT  Physical Exam:    VS:  BP (!) 150/80   Pulse 63   Ht 5' 4 (1.626 m)   Wt 150 lb (68 kg)   SpO2 98%   BMI 25.75 kg/m     Wt Readings from Last 3 Encounters:  03/08/24 150 lb (68 kg)  01/07/24 148 lb (67.1 kg)  12/21/19 161 lb (73 kg)     GEN:  Well nourished, well developed in no acute distress HEENT: Normal NECK: No JVD; No carotid bruits LYMPHATICS: No lymphadenopathy CARDIAC: RRR, no murmurs, no rubs, no gallops RESPIRATORY:  Clear to auscultation without rales, wheezing or rhonchi  ABDOMEN: Soft, non-tender, non-distended MUSCULOSKELETAL:  No edema; No deformity  SKIN: Warm and dry LOWER EXTREMITIES: no swelling NEUROLOGIC:  Alert and oriented x 3 PSYCHIATRIC:  Normal affect   ASSESSMENT:    1. Coronary artery disease involving native coronary artery of native heart without angina pectoris   2. Dyspnea on exertion   3. Dizziness    PLAN:    In order of problems listed above:  Coronary disease only mild based on coronary CT angio.  She is on aspirin which I continue.  The case risk factors modifications. Dyslipidemia cholesterol is not well-controlled but she admits that she include do significant changes in her diet and increase her exercises she wants to wait 3 months before initiation of therapy if it does not improve with exercises.  Therefore, I will schedule her to have cholesterol in the mid next 3 months. Dizziness denies having any monitor did not show any significant  arrhythmia. Overall she is doing well.  We did have a long discussion about risk modification need to exercise on the regular basis which she is trying to do   Medication Adjustments/Labs and Tests Ordered: Current medicines are reviewed at length with the patient today.  Concerns regarding medicines are outlined above.  No orders of the defined types were placed in this encounter.  Medication changes: No orders of the defined types were placed in this encounter.   Signed, Lamar DOROTHA Fitch, MD, [facility] 03/08/2024 9:41 AM    [facility]    [  1]  Current Meds  Medication Sig   Ascorbic Acid (VITAMIN C) 1000 MG tablet 1,000 mg. TAKE BY MOUTH TWO TO THREE TIMES A WEEK   aspirin EC 81 MG tablet Take 81 mg by mouth daily.   CALCIUM PO Take by mouth.   Cholecalciferol (VITAMIN D3) 125 MCG (5000 UT) CAPS Take by mouth.   Cyanocobalamin (VITAMIN B 12 PO) Take by mouth.   Fish Oil-Cholecalciferol (OMEGA-3 + D PO) Take by mouth.   Hawthorn Berry 565 MG CAPS Take by mouth.   levothyroxine  (SYNTHROID ) 88 MCG tablet Take 88 mcg by mouth daily.   MAGNESIUM PO Take by mouth.   Multiple Minerals-Vitamins (CALCIUM-MAGNESIUM-ZINC-D3) TABS Take by mouth.   sertraline  (ZOLOFT ) 25 MG tablet Take 12.5 mg by mouth daily.   Turmeric 500 MG CAPS Take by mouth.   "
